# Patient Record
Sex: Female | Born: 1985
Health system: Southern US, Community
[De-identification: ages and names within clinical notes are randomized; demographics above are authoritative.]

## PROBLEM LIST (undated history)

## (undated) DIAGNOSIS — K219 Gastro-esophageal reflux disease without esophagitis: Secondary | ICD-10-CM

## (undated) DIAGNOSIS — E039 Hypothyroidism, unspecified: Secondary | ICD-10-CM

## (undated) DIAGNOSIS — G44209 Tension-type headache, unspecified, not intractable: Secondary | ICD-10-CM

## (undated) DIAGNOSIS — F514 Sleep terrors [night terrors]: Secondary | ICD-10-CM

## (undated) DIAGNOSIS — Z973 Presence of spectacles and contact lenses: Secondary | ICD-10-CM

## (undated) HISTORY — DX: Sleep terrors (night terrors): F51.4

## (undated) HISTORY — DX: Hypothyroidism, unspecified: E03.9

## (undated) HISTORY — DX: Gastro-esophageal reflux disease without esophagitis: K21.9

## (undated) HISTORY — DX: Tension-type headache, unspecified, not intractable: G44.209

## (undated) HISTORY — PX: ESOPHAGOGASTRODUODENOSCOPY: SHX1529

## (undated) HISTORY — DX: Presence of spectacles and contact lenses: Z97.3

## (undated) HISTORY — PX: COLONOSCOPY: SHX174

---

## 2008-09-18 HISTORY — PX: ESOPHAGOGASTRODUODENOSCOPY: SHX1529

## 2008-09-18 HISTORY — PX: COLONOSCOPY: SHX174

## 2018-03-25 ENCOUNTER — Ambulatory Visit (INDEPENDENT_AMBULATORY_CARE_PROVIDER_SITE_OTHER): Payer: BLUE CROSS/BLUE SHIELD | Admitting: Medical

## 2018-03-25 VITALS — BP 122/84 | HR 91 | Temp 98.5°F | Resp 16 | Ht 67.5 in | Wt 200.8 lb

## 2018-03-25 DIAGNOSIS — N750 Cyst of Bartholin's gland: Secondary | ICD-10-CM | POA: Diagnosis not present

## 2018-03-25 DIAGNOSIS — M7701 Medial epicondylitis, right elbow: Secondary | ICD-10-CM | POA: Diagnosis not present

## 2018-03-25 DIAGNOSIS — K219 Gastro-esophageal reflux disease without esophagitis: Secondary | ICD-10-CM | POA: Diagnosis not present

## 2018-03-25 DIAGNOSIS — F514 Sleep terrors [night terrors]: Secondary | ICD-10-CM | POA: Diagnosis not present

## 2018-03-25 MED ORDER — SULFAMETHOXAZOLE-TRIMETHOPRIM 800-160 MG PO TABS: 1.0000 | ORAL_TABLET | Freq: Two times a day (BID) | ORAL | 0 refills | Status: DC

## 2018-03-25 MED ORDER — RANITIDINE HCL 150 MG PO TABS: ORAL_TABLET | ORAL | 0 refills | Status: DC

## 2018-03-25 NOTE — Progress Notes (Signed)
Subjective: Chief Complaint  Patient presents with  . np    NP get established and refill medications   Here as a new patient.    New to this area.  Moved here in early May.  From [facility].   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  She notes a history of night terrors.  She was first diagnosed with this about 7 years ago.  Prior to getting married 7 years ago no one had ever observed her having a night tear but when she got married her husband was awakened by her night terrors when she would scream out and sleepwalk.  She had a sleep evaluation ultimately was diagnosed with night terrors.  Since the diagnosis has been using Klonopin nightly which seems to do just fine to help prevent her issues.  She just got 30-day supply last week so does not need a refill today.  When she was diagnosed 7 years ago she initially had abnormal thyroid and B12 labs but it since been checked have been normal.  Last labs about a year ago.  She notes when they were moving 2 months ago she was helping lift a lot of furniture and started having pains in her right medial elbow.  She initially had a joint injection of the medial epicondyle which helped briefly but then the pain has persisted.  She did rest the arm initially but she does use her right arm as she is right-handed.  She exercise but has not been doing anything particularly strenuous with the arm  She has a Nexplanon in place for the past year.  Was on IUD prior but was having ovarian cyst with this.     Her last physical and blood work was about a year ago.  She notes a history of Bartholin's gland cyst infection in the past 1 time prior until recently was put on Bactrim for a flareup of this.  It has much improved but there is still a little bit of a cyst there.  Wants this checked out today.  History of GERD, prior EGD and colonoscopy several years ago when first  diagnosed.  Does well on omeprazole 20 mg but she takes this intermittent.  Avoids spicy foods.   No past medical history on file.   Current Outpatient Medications on File Prior to Visit  Medication Sig Dispense Refill  . clonazePAM (KLONOPIN) 0.5 MG tablet Take 0.5 mg by mouth daily.    Marland Kitchen etonogestrel (NEXPLANON) 68 MG IMPL implant 1 each by Subdermal route once.    Marland Kitchen omeprazole (PRILOSEC) 20 MG capsule Take 20 mg by mouth daily.     No current facility-administered medications on file prior to visit.    ROS as in subjective    Objective: BP 122/84   Pulse 91   Temp 98.5 F (36.9 C) (Oral)   Resp 16   Ht 5' 7.5" (1.715 m)   Wt 200 lb 12.8 oz (91.1 kg)   SpO2 98%   BMI 30.99 kg/m   General well-developed well-nourished no acute distress white female Tender over right medial epicondyle, pain with resisted pronation and mildly tender with biceps flexion, but no swelling, no deformity, normal range of motion Arms neurovascularly intact Pelvic exam: Small slightly tender cystic lesion in the Bartholin's area of the right vulva without erythema or warmth or drainage or fluctuance or induration, otherwise external genitalia within normal limits,  exam chaperoned by nurse     Assessment: Encounter Diagnoses  Name Primary?  . Night terror Yes  . Medial epicondylitis of right elbow   . Gastroesophageal reflux disease without esophagitis   . Bartholin gland cyst       Plan: Night terrors-does fine on clonazepam once daily, will await records to come in.  Medial epicondylitis of right elbow- discussed symptoms and exam findings, advised ice twice daily for the next 3 to 5 days, NSAID over-the-counter for the next 3 to 5 days, use arm sling some for rest, discussed the role of rest, and if symptoms not improving by then a week and gradually improving and let me know.  Consider referral to physical therapy  GERD- will use a trial of ranitidine to see if this helps in terms of  avoiding long-term risk of PPI.  Avoid triggers.  Of note she has had prior EGD and colonoscopy in the remote past  Bartholin gland cyst- the recent infection seems to be resolving, advised warm bath soaks for the next several days but if any worsening symptoms she can begin back on another round of Bactrim.  Medication sent to pharmacy.  Shanda BumpsJessica was seen today for np.  Diagnoses and all orders for this visit:  Night terror  Medial epicondylitis of right elbow  Gastroesophageal reflux disease without esophagitis  Bartholin gland cyst  Other orders -     sulfamethoxazole-trimethoprim (BACTRIM DS,SEPTRA DS) 800-160 MG tablet; Take 1 tablet by mouth 2 (two) times daily. -     ranitidine (ZANTAC) 150 MG tablet; Once or BID     Shanda BumpsJessica was seen today for np.  Diagnoses and all orders for this visit:  Night terror  Medial epicondylitis of right elbow  Gastroesophageal reflux disease without esophagitis  Bartholin gland cyst  Other orders -     sulfamethoxazole-trimethoprim (BACTRIM DS,SEPTRA DS) 800-160 MG tablet; Take 1 tablet by mouth 2 (two) times daily. -     ranitidine (ZANTAC) 150 MG tablet; Once or BID

## 2018-04-16 ENCOUNTER — Encounter: Payer: Self-pay | Admitting: Medical

## 2018-04-18 ENCOUNTER — Telehealth: Payer: Self-pay | Admitting: Medical

## 2018-04-18 MED ORDER — CLONAZEPAM 0.5 MG PO TABS: 0.5000 mg | ORAL_TABLET | Freq: Every day | ORAL | 0 refills | Status: DC

## 2018-04-18 NOTE — Telephone Encounter (Signed)
Pt called and left voicemail that she needs a refill on her Klonopin states that shane would start filling them for her she is almost out of them, [facility] [address], [place] - [address] and pt would like to be called and [phone] if they are sent in,

## 2018-04-18 NOTE — Telephone Encounter (Signed)
Done KH 

## 2018-04-18 NOTE — Telephone Encounter (Signed)
Let her know that I called it in 

## 2018-05-01 ENCOUNTER — Telehealth: Payer: Self-pay | Admitting: Medical

## 2018-05-01 NOTE — Telephone Encounter (Signed)
Records received from [facility]Sending back for review. Please send to Cindi after review as immunization are noted thru out records. I highlighted the ones I notice but may be more.

## 2018-05-03 ENCOUNTER — Telehealth: Payer: Self-pay | Admitting: Medical

## 2018-05-03 NOTE — Telephone Encounter (Signed)
Refer to physical therapy for epicondylitis of elbow.   [facility] [place]

## 2018-05-06 ENCOUNTER — Telehealth: Payer: Self-pay | Admitting: Medical

## 2018-05-06 NOTE — Telephone Encounter (Signed)
done

## 2018-05-06 NOTE — Telephone Encounter (Signed)
[facility] to [facility]

## 2018-05-06 NOTE — Telephone Encounter (Signed)
Please refer her to [facility] for epicondylitis.

## 2018-05-07 DIAGNOSIS — M62838 Other muscle spasm: Secondary | ICD-10-CM | POA: Diagnosis not present

## 2018-05-07 DIAGNOSIS — M25521 Pain in right elbow: Secondary | ICD-10-CM | POA: Diagnosis not present

## 2018-05-07 DIAGNOSIS — M7701 Medial epicondylitis, right elbow: Secondary | ICD-10-CM | POA: Diagnosis not present

## 2018-05-10 DIAGNOSIS — M62838 Other muscle spasm: Secondary | ICD-10-CM | POA: Diagnosis not present

## 2018-05-10 DIAGNOSIS — M25521 Pain in right elbow: Secondary | ICD-10-CM | POA: Diagnosis not present

## 2018-05-10 DIAGNOSIS — M7701 Medial epicondylitis, right elbow: Secondary | ICD-10-CM | POA: Diagnosis not present

## 2018-05-16 ENCOUNTER — Other Ambulatory Visit: Payer: Self-pay | Admitting: Family Medicine

## 2018-05-16 DIAGNOSIS — M7701 Medial epicondylitis, right elbow: Secondary | ICD-10-CM | POA: Diagnosis not present

## 2018-05-16 DIAGNOSIS — M25521 Pain in right elbow: Secondary | ICD-10-CM | POA: Diagnosis not present

## 2018-05-16 DIAGNOSIS — M62838 Other muscle spasm: Secondary | ICD-10-CM | POA: Diagnosis not present

## 2018-05-17 MED ORDER — CLONAZEPAM 0.5 MG PO TABS: 0.5000 mg | ORAL_TABLET | Freq: Every day | ORAL | 0 refills | Status: DC

## 2018-05-17 NOTE — Telephone Encounter (Signed)
[facility] pt [facility]opin. [facility]

## 2018-05-21 DIAGNOSIS — M7701 Medial epicondylitis, right elbow: Secondary | ICD-10-CM | POA: Diagnosis not present

## 2018-05-21 DIAGNOSIS — M25521 Pain in right elbow: Secondary | ICD-10-CM | POA: Diagnosis not present

## 2018-05-21 DIAGNOSIS — M62838 Other muscle spasm: Secondary | ICD-10-CM | POA: Diagnosis not present

## 2018-05-22 ENCOUNTER — Encounter: Payer: Self-pay | Admitting: Medical

## 2018-05-24 DIAGNOSIS — M7701 Medial epicondylitis, right elbow: Secondary | ICD-10-CM | POA: Diagnosis not present

## 2018-05-24 DIAGNOSIS — M25521 Pain in right elbow: Secondary | ICD-10-CM | POA: Diagnosis not present

## 2018-05-24 DIAGNOSIS — M62838 Other muscle spasm: Secondary | ICD-10-CM | POA: Diagnosis not present

## 2018-05-30 ENCOUNTER — Telehealth: Payer: Self-pay

## 2018-05-30 NOTE — Telephone Encounter (Signed)
Referral to [facility] and she had an appt 05-07-18

## 2018-06-04 DIAGNOSIS — M7701 Medial epicondylitis, right elbow: Secondary | ICD-10-CM | POA: Diagnosis not present

## 2018-06-04 DIAGNOSIS — M62838 Other muscle spasm: Secondary | ICD-10-CM | POA: Diagnosis not present

## 2018-06-04 DIAGNOSIS — M25521 Pain in right elbow: Secondary | ICD-10-CM | POA: Diagnosis not present

## 2018-06-07 DIAGNOSIS — M62838 Other muscle spasm: Secondary | ICD-10-CM | POA: Diagnosis not present

## 2018-06-07 DIAGNOSIS — M7701 Medial epicondylitis, right elbow: Secondary | ICD-10-CM | POA: Diagnosis not present

## 2018-06-07 DIAGNOSIS — M25521 Pain in right elbow: Secondary | ICD-10-CM | POA: Diagnosis not present

## 2018-06-13 DIAGNOSIS — M62838 Other muscle spasm: Secondary | ICD-10-CM | POA: Diagnosis not present

## 2018-06-13 DIAGNOSIS — M25521 Pain in right elbow: Secondary | ICD-10-CM | POA: Diagnosis not present

## 2018-06-13 DIAGNOSIS — M7701 Medial epicondylitis, right elbow: Secondary | ICD-10-CM | POA: Diagnosis not present

## 2018-06-16 ENCOUNTER — Other Ambulatory Visit: Payer: Self-pay

## 2018-06-17 MED ORDER — CLONAZEPAM 0.5 MG PO TABS: 0.5000 mg | ORAL_TABLET | Freq: Every day | ORAL | 0 refills | Status: DC

## 2018-06-17 NOTE — Telephone Encounter (Signed)
Is this ok to refill?  

## 2018-06-17 NOTE — Telephone Encounter (Signed)
Approve but make sure we have scheduled for either physical or upcoming med check.

## 2018-06-18 HISTORY — PX: NO PAST SURGERIES: SHX2092

## 2018-06-21 DIAGNOSIS — M62838 Other muscle spasm: Secondary | ICD-10-CM | POA: Diagnosis not present

## 2018-06-21 DIAGNOSIS — M7701 Medial epicondylitis, right elbow: Secondary | ICD-10-CM | POA: Diagnosis not present

## 2018-06-21 DIAGNOSIS — M25521 Pain in right elbow: Secondary | ICD-10-CM | POA: Diagnosis not present

## 2018-06-26 ENCOUNTER — Ambulatory Visit (INDEPENDENT_AMBULATORY_CARE_PROVIDER_SITE_OTHER): Payer: BLUE CROSS/BLUE SHIELD | Admitting: Medical

## 2018-06-26 ENCOUNTER — Encounter: Payer: Self-pay | Admitting: Medical

## 2018-06-26 VITALS — BP 120/76 | HR 82 | Temp 97.7°F | Resp 16 | Ht 67.0 in | Wt 197.8 lb

## 2018-06-26 DIAGNOSIS — Z13220 Encounter for screening for lipoid disorders: Secondary | ICD-10-CM | POA: Insufficient documentation

## 2018-06-26 DIAGNOSIS — E538 Deficiency of other specified B group vitamins: Secondary | ICD-10-CM | POA: Diagnosis not present

## 2018-06-26 DIAGNOSIS — Z0000 Encounter for general adult medical examination without abnormal findings: Secondary | ICD-10-CM

## 2018-06-26 DIAGNOSIS — K219 Gastro-esophageal reflux disease without esophagitis: Secondary | ICD-10-CM | POA: Diagnosis not present

## 2018-06-26 DIAGNOSIS — F514 Sleep terrors [night terrors]: Secondary | ICD-10-CM | POA: Diagnosis not present

## 2018-06-26 DIAGNOSIS — R7989 Other specified abnormal findings of blood chemistry: Secondary | ICD-10-CM | POA: Diagnosis not present

## 2018-06-26 DIAGNOSIS — M7701 Medial epicondylitis, right elbow: Secondary | ICD-10-CM | POA: Insufficient documentation

## 2018-06-26 LAB — POCT URINALYSIS DIP (PROADVANTAGE DEVICE)
Bilirubin, UA: NEGATIVE
GLUCOSE UA: NEGATIVE mg/dL
Ketones, POC UA: NEGATIVE mg/dL
Leukocytes, UA: NEGATIVE
NITRITE UA: NEGATIVE
Protein Ur, POC: NEGATIVE mg/dL
Specific Gravity, Urine: 1.01
UUROB: NEGATIVE
pH, UA: 6 (ref 5.0–8.0)

## 2018-06-26 NOTE — Progress Notes (Signed)
Subjective:   HPI  Nancy Jenkins is a 32 y.o. female who presents for Chief Complaint  Patient presents with  . cpe    fasting cpe pap 07-09-2017 vision 2-19   Medical care team includes: Tysinger, Kermit Balo, PA-C here for primary care Dentist Eye doctor  Concerns: She was a new patient her last visit a few months ago.  Doing well in general.  Working towards a 5K.  She has issues with medial epicondylitis of the right arm been seen in physical therapy for this and is much improved.  Had recent dry needling.  PT just released her to start back exercising some with weight lifting  Last Tdap 2015, just got flu shot begging of September.  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  Reviewed their medical, surgical, family, social, medication, and allergy history and updated chart as appropriate.  Past Medical History:  Diagnosis Date  . GERD (gastroesophageal reflux disease)   . Night terrors   . Tension headache   . Thyroid activity decreased    in past  . Wears contact lenses      Social History   Socioeconomic History  . Marital status: Married    Spouse name: Not on file  . Number of children: Not on file  . Years of education: Not on file  . Highest education level: Not on file  Occupational History  . Not on file  Social Needs  . Financial resource strain: Not on file  . Food insecurity:    Worry: Not on file    Inability: Not on file  . Transportation needs:    Medical: Not on file    Non-medical: Not on file  Tobacco Use  . Smoking status: Never Smoker  . Smokeless tobacco: Never Used  Substance and Sexual Activity  . Alcohol use: Yes    Comment: occasional beer  . Drug use: Not Currently  . Sexual activity: Not on file  Lifestyle  . Physical activity:    Days per week: Not on file    Minutes per session: Not on file  . Stress: Not on file  Relationships  . Social  connections:    Talks on phone: Not on file    Gets together: Not on file    Attends religious service: Not on file    Active member of club or organization: Not on file    Attends meetings of clubs or organizations: Not on file    Relationship status: Not on file  . Intimate partner violence:    Fear of current or ex partner: Not on file    Emotionally abused: Not on file    Physically abused: Not on file    Forced sexual activity: Not on file  Other Topics Concern  . Not on file  Social History Narrative   Married, has son, daughter, exercises with swimming and running.  Using cough to 5k app, doing about 3 miles three times per week.   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 Diabetes Mother   . Depression Mother   . Hypertension Father   . Deep vein thrombosis Father   . Ulcerative colitis Father   . Heart disease Brother        congenital  . Hypertension Maternal Grandmother   . Cancer Maternal Grandfather  larynx  . Hypertension Paternal Grandmother   . Stroke Paternal Grandmother   . Cancer Paternal Grandfather        liver     Current Outpatient Medications:  .  clonazePAM (KLONOPIN) 0.5 MG tablet, Take 1 tablet (0.5 mg total) by mouth daily., Disp: 30 tablet, Rfl: 0 .  etonogestrel (NEXPLANON) 68 MG IMPL implant, 1 each by Subdermal route once., Disp: , Rfl:  .  omeprazole (PRILOSEC) 20 MG capsule, Take 20 mg by mouth daily., Disp: , Rfl:   Not on File   Review of Systems Constitutional: -fever, -chills, -sweats, -unexpected weight change, -decreased appetite, -fatigue Allergy: -sneezing, -itching, -congestion Dermatology: -changing moles, --rash, -lumps ENT: -runny nose, -ear pain, -sore throat, -hoarseness, -sinus pain, -teeth pain, - ringing in ears, -hearing loss, -nosebleeds Cardiology: -chest pain, -palpitations, -swelling, -difficulty breathing when lying flat, -waking up  short of breath Respiratory: -cough, -shortness of breath, -difficulty breathing with exercise or exertion, -wheezing, -coughing up blood Gastroenterology: -abdominal pain, -nausea, -vomiting, -diarrhea, -constipation, -blood in stool, -changes in bowel movement, -difficulty swallowing or eating Hematology: -bleeding, -bruising  Musculoskeletal: +joint aches, -muscle aches, -joint swelling, -back pain, -neck pain, -cramping, -changes in gait Ophthalmology: denies vision changes, eye redness, itching, discharge Urology: -burning with urination, -difficulty urinating, -blood in urine, -urinary frequency, -urgency, -incontinence Neurology: -headache, -weakness, -tingling, -numbness, -memory loss, -falls, -dizziness Psychology: -depressed mood, -agitation, -sleep problems Breast/gyn: -breast tenderness, -discharge, -lumps, -vaginal discharge,- irregular periods, -heavy periods     Objective:  BP 120/76   Pulse 82   Temp 97.7 F (36.5 C) (Oral)   Resp 16   Ht 5\' 7"  (1.702 m)   Wt 197 lb 12.8 oz (89.7 kg)   SpO2 97%   BMI 30.98 kg/m   General appearance: alert, no distress, WD/WN, Caucasian female Skin: unremarkable, no worrisome lesions HEENT: normocephalic, conjunctiva/corneas normal, sclerae anicteric, PERRLA, EOMi, nares patent, no discharge or erythema, pharynx normal Oral cavity: MMM, tongue normal, teeth normal Neck: supple, no lymphadenopathy, no thyromegaly, no masses, normal ROM, no bruits Chest: non tender, normal shape and expansion Heart: RRR, normal S1, S2, no murmurs Lungs: CTA bilaterally, no wheezes, rhonchi, or rales Abdomen: +bs, soft, non tender, non distended, no masses, no hepatomegaly, no splenomegaly, no bruits Back: non tender, normal ROM, no scoliosis Musculoskeletal: upper extremities non tender, no obvious deformity, normal ROM throughout, lower extremities non tender, no obvious deformity, normal ROM throughout Extremities: no edema, no cyanosis, no  clubbing Pulses: 2+ symmetric, upper and lower extremities, normal cap refill Neurological: alert, oriented x 3, CN2-12 intact, strength normal upper extremities and lower extremities, sensation normal throughout, DTRs 2+ throughout, no cerebellar signs, gait normal Psychiatric: normal affect, behavior normal, pleasant  Breast/gyn/rectal - deferred to gynecology   Assessment and Plan :   Encounter Diagnoses  Name Primary?  . Routine general medical examination at a health care facility Yes  . Gastroesophageal reflux disease without esophagitis   . B12 deficiency   . Night terrors   . Medial epicondylitis of right elbow   . Abnormal thyroid blood test     Physical exam - discussed and counseled on healthy lifestyle, diet, exercise, preventative care, vaccinations, sick and well care, proper use of emergency dept and after hours care, and addressed their concerns.    Health screening: See your eye doctor yearly for routine vision care. See your dentist yearly for routine dental care including hygiene visits twice yearly.  Cancer screening Sees gynecology regularly, up to date on  pap 1 year ago  Vaccinations: Advised yearly influenza vaccine She had this in 05/2018 through work  Tdap 2015   Acute issues discussed: none  Separate significant chronic issues discussed: GERD-continue current 20 mg omeprazole, avoid triggers, discussed risk and benefits of medication.  Prior EGD 10 years ago  Medical epicondylitis - much improved, still seeing PT  Night terrors - reviewed prior records, does fine on current medication  Labs today to eval other concerns.  Nancy Jenkins was seen today for cpe.  Diagnoses and all orders for this visit:  Routine general medical examination at a health care facility -     POCT Urinalysis DIP (Proadvantage Device) -     Comprehensive metabolic panel -     CBC with Differential/Platelet -     Lipid panel -     TSH -     T4, free -     VITAMIN D 25  Hydroxy (Vit-D Deficiency, Fractures) -     Vitamin B12  Gastroesophageal reflux disease without esophagitis  B12 deficiency -     Vitamin B12  Night terrors  Medial epicondylitis of right elbow  Abnormal thyroid blood test -     TSH -     T4, free   Follow-up pending labs, yearly for physical

## 2018-06-26 NOTE — Patient Instructions (Signed)
Thanks for trusting [place] with your health care and for coming in for a physical today.  Below are some general recommendations I have for you:  Yearly screenings See your eye doctor yearly for routine vision care. See your dentist yearly for routine dental care including hygiene visits twice yearly. See me here yearly for a routine physical and preventative care visit   Dr. Yancey Flemings, dentist [address], [place], [place] [address] [phone] [email or web address]    Preventative Care for Adults - Female      MAINTAIN REGULAR HEALTH EXAMS:  A routine yearly physical is a good way to check in with your primary care provider about your health and preventive screening. It is also an opportunity to share updates about your health and any concerns you have, and receive a thorough all-over exam.   Most health insurance companies pay for at least some preventative services.  Check with your health plan for specific coverages.  WHAT PREVENTATIVE SERVICES DO WOMEN NEED?  Adult women should have their weight and blood pressure checked regularly.   Women age 46 and older should have their cholesterol levels checked regularly.  Women should be screened for cervical cancer with a Pap smear and pelvic exam beginning at either age 62, or 3 years after they become sexually activity.    Breast cancer screening generally begins at age 56 with a mammogram and breast exam by your primary care provider.    Beginning at age 76 and continuing to age 33, women should be screened for colorectal cancer.  Certain people may need continued testing until age 50.  Updating vaccinations is part of preventative care.  Vaccinations help protect against diseases such as the flu.  Osteoporosis is a disease in which the bones lose minerals and strength as we age. Women ages 78 and over should discuss this with their caregivers, as should women after menopause who have other risk factors.  Lab tests are generally  done as part of preventative care to screen for anemia and blood disorders, to screen for problems with the kidneys and liver, to screen for bladder problems, to check blood sugar, and to check your cholesterol level.  Preventative services generally include counseling about diet, exercise, avoiding tobacco, drugs, excessive alcohol consumption, and sexually transmitted infections.    GENERAL RECOMMENDATIONS FOR GOOD HEALTH:  Healthy diet:  Eat a variety of foods, including fruit, vegetables, animal or vegetable protein, such as meat, fish, chicken, and eggs, or beans, lentils, tofu, and grains, such as rice.  Drink plenty of water daily.  Decrease saturated fat in the diet, avoid lots of red meat, processed foods, sweets, fast foods, and fried foods.  Exercise:  Aerobic exercise helps maintain good heart health. At least 30-40 minutes of moderate-intensity exercise is recommended. For example, a brisk walk that increases your heart rate and breathing. This should be done on most days of the week.   Find a type of exercise or a variety of exercises that you enjoy so that it becomes a part of your daily life.  Examples are running, walking, swimming, water aerobics, and biking.  For motivation and support, explore group exercise such as aerobic class, spin class, Zumba, Yoga,or  martial arts, etc.    Set exercise goals for yourself, such as a certain weight goal, walk or run in a race such as a 5k walk/run.  Speak to your primary care provider about exercise goals.  Disease prevention:  If you smoke or chew tobacco, find  out from your caregiver how to quit. It can literally save your life, no matter how long you have been a tobacco user. If you do not use tobacco, never begin.   Maintain a healthy diet and normal weight. Increased weight leads to problems with blood pressure and diabetes.   The Body Mass Index or BMI is a way of measuring how much of your body is fat. Having a BMI above 27  increases the risk of heart disease, diabetes, hypertension, stroke and other problems related to obesity. Your caregiver can help determine your BMI and based on it develop an exercise and dietary program to help you achieve or maintain this important measurement at a healthful level.  High blood pressure causes heart and blood vessel problems.  Persistent high blood pressure should be treated with medicine if weight loss and exercise do not work.   Fat and cholesterol leaves deposits in your arteries that can block them. This causes heart disease and vessel disease elsewhere in your body.  If your cholesterol is found to be high, or if you have heart disease or certain other medical conditions, then you may need to have your cholesterol monitored frequently and be treated with medication.   Ask if you should have a cardiac stress test if your history suggests this. A stress test is a test done on a treadmill that looks for heart disease. This test can find disease prior to there being a problem.  Menopause can be associated with physical symptoms and risks. Hormone replacement therapy is available to decrease these. You should talk to your caregiver about whether starting or continuing to take hormones is right for you.   Osteoporosis is a disease in which the bones lose minerals and strength as we age. This can result in serious bone fractures. Risk of osteoporosis can be identified using a bone density scan. Women ages 23 and over should discuss this with their caregivers, as should women after menopause who have other risk factors. Ask your caregiver whether you should be taking a calcium supplement and Vitamin D, to reduce the rate of osteoporosis.   Avoid drinking alcohol in excess (more than two drinks per day).  Avoid use of street drugs. Do not share needles with anyone. Ask for professional help if you need assistance or instructions on stopping the use of alcohol, cigarettes, and/or  drugs.  Brush your teeth twice a day with fluoride toothpaste, and floss once a day. Good oral hygiene prevents tooth decay and gum disease. The problems can be painful, unattractive, and can cause other health problems. Visit your dentist for a routine oral and dental check up and preventive care every 6-12 months.   Look at your skin regularly.  Use a mirror to look at your back. Notify your caregivers of changes in moles, especially if there are changes in shapes, colors, a size larger than a pencil eraser, an irregular border, or development of new moles.  Safety:  Use seatbelts 100% of the time, whether driving or as a passenger.  Use safety devices such as hearing protection if you work in environments with loud noise or significant background noise.  Use safety glasses when doing any work that could send debris in to the eyes.  Use a helmet if you ride a bike or motorcycle.  Use appropriate safety gear for contact sports.  Talk to your caregiver about gun safety.  Use sunscreen with a SPF (or skin protection factor) of 15 or greater.  Lighter skinned people are at a greater risk of skin cancer. Don't forget to also wear sunglasses in order to protect your eyes from too much damaging sunlight. Damaging sunlight can accelerate cataract formation.   Practice safe sex. Use condoms. Condoms are used for birth control and to help reduce the spread of sexually transmitted infections (or STIs).  Some of the STIs are gonorrhea (the clap), chlamydia, syphilis, trichomonas, herpes, HPV (human papilloma virus) and HIV (human immunodeficiency virus) which causes AIDS. The herpes, HIV and HPV are viral illnesses that have no cure. These can result in disability, cancer and death.   Keep carbon monoxide and smoke detectors in your home functioning at all times. Change the batteries every 6 months or use a model that plugs into the wall.   Vaccinations:  Stay up to date with your tetanus shots and other  required immunizations. You should have a booster for tetanus every 10 years. Be sure to get your flu shot every year, since 5%-20% of the [place] population comes down with the flu. The flu vaccine changes each year, so being vaccinated once is not enough. Get your shot in the fall, before the flu season peaks.   Other vaccines to consider:  Human Papilloma Virus or HPV causes cancer of the cervix, and other infections that can be transmitted from person to person. There is a vaccine for HPV, and females should get immunized between the ages of 56 and 72. It requires a series of 3 shots.   Pneumococcal vaccine to protect against certain types of pneumonia.  This is normally recommended for adults age 79 or older.  However, adults younger than 32 years old with certain underlying conditions such as diabetes, heart or lung disease should also receive the vaccine.  Shingles vaccine to protect against Varicella Zoster if you are older than age 23, or younger than 32 years old with certain underlying illness.  If you have not had the Shingrix vaccine, please call your insurer to inquire about coverage for the Shingrix vaccine given in 2 doses.   Some insurers cover this vaccine after age 72, some cover this after age 24.  If your insurer covers this, then call to schedule appointment to have this vaccine here  Hepatitis A vaccine to protect against a form of infection of the liver by a virus acquired from food.  Hepatitis B vaccine to protect against a form of infection of the liver by a virus acquired from blood or body fluids, particularly if you work in health care.  If you plan to travel internationally, check with your local health department for specific vaccination recommendations.  Cancer Screening:  Breast cancer screening is essential to preventive care for women. All women age 24 and older should perform a breast self-exam every month. At age 61 and older, women should have their caregiver  complete a breast exam each year. Women at ages 63 and older should have a mammogram (x-ray film) of the breasts. Your caregiver can discuss how often you need mammograms.    Cervical cancer screening includes taking a Pap smear (sample of cells examined under a microscope) from the cervix (end of the uterus). It also includes testing for HPV (Human Papilloma Virus, which can cause cervical cancer). Screening and a pelvic exam should begin at age 1, or 3 years after a woman becomes sexually active. Screening should occur every year, with a Pap smear but no HPV testing, up to age 64. After age 66, you  should have a Pap smear every 3 years with HPV testing, if no HPV was found previously.   Most routine colon cancer screening begins at the age of 32. On a yearly basis, doctors may provide special easy to use take-home tests to check for hidden blood in the stool. Sigmoidoscopy or colonoscopy can detect the earliest forms of colon cancer and is life saving. These tests use a small camera at the end of a tube to directly examine the colon. Speak to your caregiver about this at age 60, when routine screening begins (and is repeated every 5 years unless early forms of pre-cancerous polyps or small growths are found).

## 2018-06-27 ENCOUNTER — Other Ambulatory Visit: Payer: Self-pay | Admitting: Medical

## 2018-06-27 ENCOUNTER — Other Ambulatory Visit: Payer: Self-pay

## 2018-06-27 DIAGNOSIS — K219 Gastro-esophageal reflux disease without esophagitis: Secondary | ICD-10-CM

## 2018-06-27 LAB — COMPREHENSIVE METABOLIC PANEL
ALBUMIN: 4.4 g/dL (ref 3.5–5.5)
ALK PHOS: 62 IU/L (ref 39–117)
ALT: 23 IU/L (ref 0–32)
AST: 19 IU/L (ref 0–40)
Albumin/Globulin Ratio: 1.4 (ref 1.2–2.2)
BUN / CREAT RATIO: 12 (ref 9–23)
BUN: 11 mg/dL (ref 6–20)
Bilirubin Total: 0.4 mg/dL (ref 0.0–1.2)
CO2: 24 mmol/L (ref 20–29)
CREATININE: 0.91 mg/dL (ref 0.57–1.00)
Calcium: 9.9 mg/dL (ref 8.7–10.2)
Chloride: 100 mmol/L (ref 96–106)
GFR calc Af Amer: 97 mL/min/{1.73_m2} (ref >59)
GFR calc non Af Amer: 84 mL/min/{1.73_m2} (ref >59)
GLUCOSE: 84 mg/dL (ref 65–99)
Globulin, Total: 3.2 g/dL (ref 1.5–4.5)
Potassium: 4.7 mmol/L (ref 3.5–5.2)
Sodium: 140 mmol/L (ref 134–144)
Total Protein: 7.6 g/dL (ref 6.0–8.5)

## 2018-06-27 LAB — CBC WITH DIFFERENTIAL/PLATELET
BASOS: 1 %
Basophils Absolute: 0 10*3/uL (ref 0.0–0.2)
EOS (ABSOLUTE): 0.1 10*3/uL (ref 0.0–0.4)
Eos: 1 %
Hematocrit: 41.3 % (ref 34.0–46.6)
Hemoglobin: 13.8 g/dL (ref 11.1–15.9)
Immature Grans (Abs): 0 10*3/uL (ref 0.0–0.1)
Immature Granulocytes: 0 %
LYMPHS ABS: 1.7 10*3/uL (ref 0.7–3.1)
Lymphs: 25 %
MCH: 29.7 pg (ref 26.6–33.0)
MCHC: 33.4 g/dL (ref 31.5–35.7)
MCV: 89 fL (ref 79–97)
MONOS ABS: 0.6 10*3/uL (ref 0.1–0.9)
Monocytes: 9 %
NEUTROS ABS: 4.3 10*3/uL (ref 1.4–7.0)
NEUTROS PCT: 64 %
Platelets: 333 10*3/uL (ref 150–450)
RBC: 4.64 x10E6/uL (ref 3.77–5.28)
RDW: 12.6 % (ref 12.3–15.4)
WBC: 6.7 10*3/uL (ref 3.4–10.8)

## 2018-06-27 LAB — VITAMIN D 25 HYDROXY (VIT D DEFICIENCY, FRACTURES): Vit D, 25-Hydroxy: 33.6 ng/mL (ref 30.0–100.0)

## 2018-06-27 LAB — LIPID PANEL
CHOL/HDL RATIO: 3.3 ratio (ref 0.0–4.4)
Cholesterol, Total: 154 mg/dL (ref 100–199)
HDL: 46 mg/dL (ref >39)
LDL Calculated: 94 mg/dL (ref 0–99)
Triglycerides: 70 mg/dL (ref 0–149)
VLDL Cholesterol Cal: 14 mg/dL (ref 5–40)

## 2018-06-27 LAB — T4, FREE: FREE T4: 1.03 ng/dL (ref 0.82–1.77)

## 2018-06-27 LAB — VITAMIN B12: VITAMIN B 12: 732 pg/mL (ref 232–1245)

## 2018-06-27 LAB — TSH: TSH: 4.49 u[IU]/mL (ref 0.450–4.500)

## 2018-06-27 MED ORDER — OMEPRAZOLE 20 MG PO CPDR: 20.0000 mg | DELAYED_RELEASE_CAPSULE | Freq: Every day | ORAL | 1 refills | Status: DC

## 2018-06-27 MED ORDER — CLONAZEPAM 0.5 MG PO TABS: 0.5000 mg | ORAL_TABLET | Freq: Every day | ORAL | 5 refills | Status: DC

## 2018-07-11 DIAGNOSIS — M7701 Medial epicondylitis, right elbow: Secondary | ICD-10-CM | POA: Diagnosis not present

## 2018-07-11 DIAGNOSIS — M25521 Pain in right elbow: Secondary | ICD-10-CM | POA: Diagnosis not present

## 2018-07-11 DIAGNOSIS — M62838 Other muscle spasm: Secondary | ICD-10-CM | POA: Diagnosis not present

## 2018-07-25 DIAGNOSIS — M62838 Other muscle spasm: Secondary | ICD-10-CM | POA: Diagnosis not present

## 2018-07-25 DIAGNOSIS — M7701 Medial epicondylitis, right elbow: Secondary | ICD-10-CM | POA: Diagnosis not present

## 2018-07-25 DIAGNOSIS — M25521 Pain in right elbow: Secondary | ICD-10-CM | POA: Diagnosis not present

## 2018-08-08 DIAGNOSIS — M62838 Other muscle spasm: Secondary | ICD-10-CM | POA: Diagnosis not present

## 2018-08-08 DIAGNOSIS — M7701 Medial epicondylitis, right elbow: Secondary | ICD-10-CM | POA: Diagnosis not present

## 2018-08-08 DIAGNOSIS — M25521 Pain in right elbow: Secondary | ICD-10-CM | POA: Diagnosis not present

## 2018-09-24 ENCOUNTER — Encounter: Payer: BLUE CROSS/BLUE SHIELD | Admitting: Medical

## 2018-10-02 ENCOUNTER — Ambulatory Visit (INDEPENDENT_AMBULATORY_CARE_PROVIDER_SITE_OTHER): Payer: BLUE CROSS/BLUE SHIELD | Admitting: Medical

## 2018-10-02 VITALS — BP 120/74 | HR 99 | Temp 97.7°F | Resp 16 | Wt 204.6 lb

## 2018-10-02 DIAGNOSIS — K219 Gastro-esophageal reflux disease without esophagitis: Secondary | ICD-10-CM

## 2018-10-02 DIAGNOSIS — F514 Sleep terrors [night terrors]: Secondary | ICD-10-CM

## 2018-10-02 NOTE — Progress Notes (Signed)
Subjective: Chief Complaint  Patient presents with  . med check    med check    Here for med check.  She notes a history of night terrors.  Doing well on current medications.  She was first diagnosed with this about 7 years ago.  Prior to getting married 7 years ago no one had ever observed her having a night tear but when she got married her husband was awakened by her night terrors when she would scream out and sleepwalk.  She had a sleep evaluation ultimately was diagnosed with night terrors.  Since the diagnosis has been using Klonopin nightly which seems to do just fine to help prevent her issues.    History of GERD, prior EGD and colonoscopy several years ago when first diagnosed.  Does well on omeprazole 20 mg but she takes this intermittent.  Avoids spicy foods.   Past Medical History:  Diagnosis Date  . GERD (gastroesophageal reflux disease)   . Night terrors   . Tension headache   . Thyroid activity decreased    in past  . Wears contact lenses      Current Outpatient Medications on File Prior to Visit  Medication Sig Dispense Refill  . clonazePAM (KLONOPIN) 0.5 MG tablet Take 1 tablet (0.5 mg total) by mouth daily. 30 tablet 5  . etonogestrel (NEXPLANON) 68 MG IMPL implant 1 each by Subdermal route once.    Marland Kitchen omeprazole (PRILOSEC) 20 MG capsule Take 1 capsule (20 mg total) by mouth daily. 90 capsule 1   No current facility-administered medications on file prior to visit.    ROS as in subjective     Objective: BP 120/74   Pulse 99   Temp 97.7 F (36.5 C) (Oral)   Resp 16   Wt 204 lb 9.6 oz (92.8 kg)   SpO2 98%   BMI 32.04 kg/m   General well-developed well-nourished no acute distress white female Psych: pleasant, good eye contact, answers questions appropriately    Assessment:  Encounter Diagnoses  Name Primary?  . Night terrors Yes  . Gastroesophageal reflux disease without esophagitis       Plan: Night terrors-does fine on clonazepam once  daily  GERD- discussed diet, precautions, c/t current medication and discussed long-term risk of PPI.  Avoid triggers.  Of note she has had prior EGD and colonoscopy in the remote past  Nancy Jenkins was seen today for med check.  Diagnoses and all orders for this visit:  Night terrors  Gastroesophageal reflux disease without esophagitis

## 2018-12-30 ENCOUNTER — Telehealth: Payer: Self-pay | Admitting: Medical

## 2018-12-30 NOTE — Telephone Encounter (Signed)
Pt is on the schedule for 12/31/2018

## 2018-12-30 NOTE — Telephone Encounter (Signed)
Please set her up a virtual visit to discuss medications

## 2018-12-31 ENCOUNTER — Ambulatory Visit (INDEPENDENT_AMBULATORY_CARE_PROVIDER_SITE_OTHER): Payer: BLUE CROSS/BLUE SHIELD | Admitting: Medical

## 2018-12-31 ENCOUNTER — Other Ambulatory Visit: Payer: Self-pay

## 2018-12-31 ENCOUNTER — Encounter: Payer: Self-pay | Admitting: Medical

## 2018-12-31 VITALS — Temp 97.1°F | Ht 66.0 in | Wt 193.0 lb

## 2018-12-31 DIAGNOSIS — E669 Obesity, unspecified: Secondary | ICD-10-CM | POA: Diagnosis not present

## 2018-12-31 DIAGNOSIS — R7989 Other specified abnormal findings of blood chemistry: Secondary | ICD-10-CM

## 2018-12-31 MED ORDER — NALTREXONE-BUPROPION HCL ER 8-90 MG PO TB12: 2.0000 | ORAL_TABLET | Freq: Two times a day (BID) | ORAL | 1 refills | Status: DC

## 2018-12-31 NOTE — Progress Notes (Signed)
Subjective:     Patient ID: Nancy Jenkins, female   DOB: 09/06/1986, 33 y.o.   MRN: [id number]  This visit type was conducted due to national recommendations for restrictions regarding the COVID-19 Pandemic (e.g. social distancing) in an effort to limit this patient's exposure and mitigate transmission in our community.  his format is felt to be most appropriate for this patient at this time.    Documentation for virtual audio and video telecommunications through Zoom encounter:  The patient was located at home. The provider was located in the office. The patient did consent to this visit and is aware of possible charges through their insurance for this visit.  The other persons participating in this telemedicine service were none. Time spent on call was 15 minutes and in review of previous records >20 minutes total.  This virtual service is not related to other E/M service within previous 7 days.   HPI Chief Complaint  Patient presents with  . med check    med check    Virtual visit today to discuss weight and obesity.  She would like to go back on Contrave.  She was on this this past year and lost 25 pounds, tolerated the medication well without side effect.  She was on the regular dose of 2 tablets twice daily.  Back of the first of the year she was doing ketogenic eating program and lost down to 189 pounds, was exercising regularly, ran a 5K in January and was doing well with her plan. 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  She is still exercising, but this has been difficult with her kids being confined to the home, having to do lessons for her kids and having to adjust work hours because the pandemic.  She is continuing to find ways to walk or do other exercise.  Wants to get back into running more regularly.  Would like to do  another 5K.  She has used carb counting app on the phone to help track calories and uses her apple watch to track steps  She denies any change in appetite, mood is good, no chest pain no swelling no edema no shortness of breath.  Past Medical History:  Diagnosis Date  . GERD (gastroesophageal reflux disease)   . Night terrors   . Tension headache   . Thyroid activity decreased    in past  . Wears contact lenses    Current Outpatient Medications on File Prior to Visit  Medication Sig Dispense Refill  . clonazePAM (KLONOPIN) 0.5 MG tablet Take 1 tablet (0.5 mg total) by mouth daily. 30 tablet 5  . etonogestrel (NEXPLANON) 68 MG IMPL implant 1 each by Subdermal route once.    Marland Kitchen. omeprazole (PRILOSEC) 20 MG capsule Take 1 capsule (20 mg total) by mouth daily. 90 capsule 1   No current facility-administered medications on file prior to visit.     Review of Systems As in subjective    Objective:   Physical Exam Due to coronavirus pandemic stay at home measures, patient visit was virtual and they were not examined in person.   Gen: wd, wn, nad      Assessment:     Encounter Diagnoses  Name Primary?  . Obesity, unspecified classification, unspecified obesity type, unspecified whether serious comorbidity present Yes  . Abnormal thyroid blood test     .  Plan:     We discussed exercise, continuing to set goals for herself, continue back with running, discussed diet, continue ketogenic diet or get back on track with what she was doing that was successful with low-carb diet.  Advise she use the smart phone app to help track calories and efforts, advise she use her smart watch to track steps and to gradually increase her goal steps for the day  Begin back on Contrave with titration up as discussed and as below.  Discussed risk and benefits.  Advise she recheck in 1 month through my chart on efforts, tolerability, and we will need to recheck thyroid labs in the next 1 to 2  months  Nancy Jenkins was seen today for med check.  Diagnoses and all orders for this visit:  Obesity, unspecified classification, unspecified obesity type, unspecified whether serious comorbidity present  Abnormal thyroid blood test  Other orders -     Naltrexone-buPROPion HCl ER 8-90 MG TB12; Take 2 tablets by mouth 2 (two) times daily with a meal.

## 2019-01-01 ENCOUNTER — Telehealth: Payer: Self-pay | Admitting: Medical

## 2019-01-01 ENCOUNTER — Other Ambulatory Visit: Payer: Self-pay | Admitting: Medical

## 2019-01-01 DIAGNOSIS — K219 Gastro-esophageal reflux disease without esophagitis: Secondary | ICD-10-CM

## 2019-01-01 MED ORDER — CLONAZEPAM 0.5 MG PO TABS: 0.5000 mg | ORAL_TABLET | Freq: Every day | ORAL | 0 refills | Status: DC

## 2019-01-01 MED ORDER — OMEPRAZOLE 20 MG PO CPDR: 20.0000 mg | DELAYED_RELEASE_CAPSULE | Freq: Every day | ORAL | 1 refills | Status: DC

## 2019-01-01 NOTE — Telephone Encounter (Signed)
Pt is requesting her omeprazole and her clonazepam to the [facility], and she would like a 90 day supply, pt needs it to go to the [facility] [place] [place] - [address]

## 2019-01-03 ENCOUNTER — Telehealth: Payer: Self-pay | Admitting: Medical

## 2019-01-03 NOTE — Telephone Encounter (Signed)
Is this ok to refill?  

## 2019-01-03 NOTE — Telephone Encounter (Signed)
Sent in already. disragrd this message

## 2019-01-03 NOTE — Telephone Encounter (Signed)
I just sent in Clonazepam to [facility] a few days ago, so not sure why we are getting the request again.

## 2019-01-03 NOTE — Telephone Encounter (Signed)
Fax from [facility] rx Clonazepam ODT tab

## 2019-01-13 ENCOUNTER — Other Ambulatory Visit: Payer: Self-pay

## 2019-01-14 NOTE — Telephone Encounter (Signed)
Check on this.   I already refilled on 01/01/19 I thought.

## 2019-01-14 NOTE — Telephone Encounter (Signed)
Is this okay to refill? 

## 2019-01-15 MED ORDER — CLONAZEPAM 0.5 MG PO TABS: 0.5000 mg | ORAL_TABLET | Freq: Every day | ORAL | 2 refills | Status: DC

## 2019-01-15 NOTE — Telephone Encounter (Signed)
It looks like it was sent to mail order.

## 2019-01-15 NOTE — Telephone Encounter (Signed)
Yes, I had previous note that mail order was requested.  Please check with her so we can get this straightened out.

## 2019-01-15 NOTE — Telephone Encounter (Signed)
Patient states that mail order will not send it to her.  She got a letter stating that they will not be able to mail that out.   Please send it to the local pharmacy.

## 2019-03-12 ENCOUNTER — Other Ambulatory Visit: Payer: Self-pay | Admitting: Medical

## 2019-03-12 NOTE — Telephone Encounter (Signed)
Is this ok to refill?  

## 2019-03-27 DIAGNOSIS — Z20828 Contact with and (suspected) exposure to other viral communicable diseases: Secondary | ICD-10-CM | POA: Diagnosis not present

## 2019-04-03 ENCOUNTER — Encounter: Payer: Self-pay | Admitting: Medical

## 2019-04-03 ENCOUNTER — Ambulatory Visit (INDEPENDENT_AMBULATORY_CARE_PROVIDER_SITE_OTHER): Payer: BC Managed Care – PPO | Admitting: Medical

## 2019-04-03 ENCOUNTER — Telehealth: Payer: Self-pay | Admitting: Medical

## 2019-04-03 ENCOUNTER — Other Ambulatory Visit: Payer: Self-pay

## 2019-04-03 VITALS — BP 122/78 | HR 80 | Ht 66.0 in | Wt 184.0 lb

## 2019-04-03 DIAGNOSIS — N750 Cyst of Bartholin's gland: Secondary | ICD-10-CM

## 2019-04-03 DIAGNOSIS — E663 Overweight: Secondary | ICD-10-CM | POA: Diagnosis not present

## 2019-04-03 DIAGNOSIS — K219 Gastro-esophageal reflux disease without esophagitis: Secondary | ICD-10-CM | POA: Diagnosis not present

## 2019-04-03 DIAGNOSIS — F514 Sleep terrors [night terrors]: Secondary | ICD-10-CM | POA: Diagnosis not present

## 2019-04-03 DIAGNOSIS — Z79899 Other long term (current) drug therapy: Secondary | ICD-10-CM

## 2019-04-03 MED ORDER — SULFAMETHOXAZOLE-TRIMETHOPRIM 800-160 MG PO TABS: 1.0000 | ORAL_TABLET | Freq: Two times a day (BID) | ORAL | 0 refills | Status: DC

## 2019-04-03 MED ORDER — CLONAZEPAM 0.5 MG PO TABS: 0.5000 mg | ORAL_TABLET | Freq: Every day | ORAL | 0 refills | Status: DC

## 2019-04-03 NOTE — Telephone Encounter (Signed)
Please refer to Dr. Sumner Boast for gynecology.

## 2019-04-03 NOTE — Progress Notes (Signed)
Subjective:     Patient ID: Nancy Jenkins, female   DOB: 07/26/1986, 33 y.o.   MRN: [id number]  This visit type was conducted due to national recommendations for restrictions regarding the COVID-19 Pandemic (e.g. social distancing) in an effort to limit this patient's exposure and mitigate transmission in our community.  This format is felt to be most appropriate for this patient at this time.    Documentation for virtual audio and video telecommunications through Zoom encounter:  The patient was located at home. The provider was located in the office. The patient did consent to this visit and is aware of possible charges through their insurance for this visit.  The other persons participating in this telemedicine service were none. Time spent on call was 15 minutes and in review of previous records >15 minutes total.  This virtual service is not related to other E/M service within previous 7 days.   HPI Chief Complaint  Patient presents with  . Medication Management  . Cyst    feels like she's getting a labial cyst   Virtual consult today for med check.  In general doing well.  She is taking Contrave medication to help with weight management.  She exercises regularly at least 3 to 4 days/week she has a home gym.  She does weights and cardio regularly.  She is being careful with her diet doing more of a low-carb diet.  She drinks mostly water.  She does not eat out very often.  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  Fortunately she is not herself been positive.  She recently had a negative antibody test.  She recently had a flareup of her Bartholin's cyst and would like a prescription for Bactrim.  I saw her for this several months ago.  She would like a referral to gynecology as she is due to have her Nexplanon changed in a few months.  She  is doing fine on her current medication Klonopin for night terrors.  This is been her medication has worked fine for years for this situation.  Otherwise normal state of health.  No big issues otherwise.  Past Medical History:  Diagnosis Date  . GERD (gastroesophageal reflux disease)   . Night terrors   . Tension headache   . Thyroid activity decreased    in past  . Wears contact lenses    Current Outpatient Medications on File Prior to Visit  Medication Sig Dispense Refill  . CONTRAVE 8-90 MG TB12 TAKE TWO TABLETS BY MOUTH TWICE A DAY WITH A MEAL 120 tablet 1  . etonogestrel (NEXPLANON) 68 MG IMPL implant 1 each by Subdermal route once.    Marland Kitchen omeprazole (PRILOSEC) 20 MG capsule Take 1 capsule (20 mg total) by mouth daily. 90 capsule 1   No current facility-administered medications on file prior to visit.      Review of Systems As in subjective    Objective:   Physical Exam Due to coronavirus pandemic stay at home measures, patient visit was virtual and they were not examined in person.   BP 122/78   Pulse 80   Ht 5\' 6"  (1.676 m)   Wt 184 lb (83.5 kg)   BMI 29.70 kg/m  Gen: wd, wn, nad      Assessment:     Encounter Diagnoses  Name Primary?  Marland Kitchen  Bartholin gland cyst Yes  . Night terrors   . Overweight (BMI 25.0-29.9)   . Gastroesophageal reflux disease without esophagitis   . High risk medication use        Plan:     Bartholin gland cyst- begin round of Bactrim.  We will go ahead and refer to gynecology for follow-up on this as well as routine gynecology as she will need update on Nexplanon in the next few months  Night terrors-she does well on clonazepam which she has been on long-term  Overweight-does well on Contrave, continue efforts at weight loss and healthy diet, she continues to work towards her goal  GERD-doing fine on current medication, trigger avoidance   Shanda BumpsJessica was seen today for medication management and cyst.  Diagnoses and all orders for  this visit:  Bartholin gland cyst -     Ambulatory referral to Gynecology  Night terrors  Overweight (BMI 25.0-29.9)  Gastroesophageal reflux disease without esophagitis  High risk medication use  Other orders -     sulfamethoxazole-trimethoprim (BACTRIM DS) 800-160 MG tablet; Take 1 tablet by mouth 2 (two) times daily. -     clonazePAM (KLONOPIN) 0.5 MG tablet; Take 1 tablet (0.5 mg total) by mouth daily.

## 2019-04-03 NOTE — Telephone Encounter (Signed)
done

## 2019-04-15 ENCOUNTER — Encounter: Payer: Self-pay | Admitting: Obstetrics and Gynecology

## 2019-05-15 ENCOUNTER — Ambulatory Visit: Payer: BC Managed Care – PPO | Admitting: Obstetrics and Gynecology

## 2019-05-26 ENCOUNTER — Other Ambulatory Visit: Payer: Self-pay | Admitting: Medical

## 2019-05-26 DIAGNOSIS — K219 Gastro-esophageal reflux disease without esophagitis: Secondary | ICD-10-CM

## 2019-06-24 ENCOUNTER — Other Ambulatory Visit: Payer: Self-pay

## 2019-06-25 ENCOUNTER — Telehealth: Payer: Self-pay

## 2019-06-25 ENCOUNTER — Ambulatory Visit: Payer: BC Managed Care – PPO | Admitting: Obstetrics and Gynecology

## 2019-06-25 ENCOUNTER — Other Ambulatory Visit: Payer: Self-pay | Admitting: Medical

## 2019-06-25 MED ORDER — SULFAMETHOXAZOLE-TRIMETHOPRIM 800-160 MG PO TABS: 1.0000 | ORAL_TABLET | Freq: Two times a day (BID) | ORAL | 0 refills | Status: DC

## 2019-06-25 NOTE — Telephone Encounter (Signed)
I sent another round of bactrim.  Sorry they were rude to her.    If agreeable, lets refer to Dr. Talbert Nan.  If she has acute redness, swelling, can come in and I can possibly do I&D.  For recurrence though, lets try and get her in with Dr. Talbert Nan.

## 2019-06-25 NOTE — Telephone Encounter (Signed)
Patient called and stated she went to her OBGYN appointment today and she was told she cannot be seen because she had her child with her. She informed them that she has no other means for childcare since she has no family here and school is virtual. Pt stated they were rude and told her she will have to find other means. Pt now wants to know if something can be sent to the pharmacy for her as before for the cyst until she finds an OBGYN. Please advise.

## 2019-06-26 NOTE — Telephone Encounter (Signed)
Ok

## 2019-06-26 NOTE — Telephone Encounter (Signed)
Patient was referred to Dr. Talbert Nan and that's where the office staff was rude to her. She never even got to see the provider. She wants to find her own ob and call us to let us know where to refer her.

## 2019-07-14 ENCOUNTER — Ambulatory Visit: Payer: BC Managed Care – PPO | Admitting: Obstetrics and Gynecology

## 2019-07-17 ENCOUNTER — Other Ambulatory Visit: Payer: Self-pay

## 2019-07-17 MED ORDER — CLONAZEPAM 0.5 MG PO TABS: 0.5000 mg | ORAL_TABLET | Freq: Every day | ORAL | 0 refills | Status: DC

## 2019-08-07 ENCOUNTER — Encounter: Payer: Self-pay | Admitting: Obstetrics and Gynecology

## 2019-08-07 ENCOUNTER — Ambulatory Visit (INDEPENDENT_AMBULATORY_CARE_PROVIDER_SITE_OTHER): Payer: BC Managed Care – PPO | Admitting: Obstetrics and Gynecology

## 2019-08-07 ENCOUNTER — Other Ambulatory Visit: Payer: Self-pay

## 2019-08-07 VITALS — BP 133/84 | HR 85 | Wt 193.0 lb

## 2019-08-07 DIAGNOSIS — Z8619 Personal history of other infectious and parasitic diseases: Secondary | ICD-10-CM | POA: Insufficient documentation

## 2019-08-07 DIAGNOSIS — N750 Cyst of Bartholin's gland: Secondary | ICD-10-CM

## 2019-08-07 DIAGNOSIS — Z975 Presence of (intrauterine) contraceptive device: Secondary | ICD-10-CM | POA: Diagnosis not present

## 2019-08-07 NOTE — Progress Notes (Signed)
Obstetrics and Gynecology New Patient Evaluation  Appointment Date: 08/07/2019  OBGYN Clinic: [facility]  Primary Care Provider: Carlena Hurl  Referring Provider: Carlena Hurl, PA-C  Chief Complaint: history of recurrent left bartholin's gland cyst. Establish gyn care  History of Present Illness: Nancy Jenkins is a 33 y.o. Caucasian [id number] (No LMP recorded. (Menstrual status: Other).), seen for the above chief complaint.   Last bartholin issue approximately 4-6 weeks ago. She states she first started having episodes approximately 4 years ago, has never had any I&D or surgery for it and it always resolves with bactrim; pt currently asymptomatic.  No VB, discharge, itching.   Review of Systems:  as noted in the History of Present Illness.  Patient Active Problem List   Diagnosis Date Noted  . History of chlamydia 08/07/2019  . Nexplanon in place 08/07/2019  . Overweight (BMI 25.0-29.9) 04/03/2019  . Cyst of left Bartholin's gland 04/03/2019  . High risk medication use 04/03/2019  . Routine general medical examination at a health care facility 06/26/2018  . Gastroesophageal reflux disease without esophagitis 06/26/2018  . B12 deficiency 06/26/2018  . Night terrors 06/26/2018  . Medial epicondylitis of right elbow 06/26/2018    Past Medical History:  Past Medical History:  Diagnosis Date  . GERD (gastroesophageal reflux disease)   . Night terrors   . Tension headache   . Thyroid activity decreased    in past  . Wears contact lenses     Past Surgical History:  Past Surgical History:  Procedure Laterality Date  . ESOPHAGOGASTRODUODENOSCOPY  2010   biopsy, no h pylori, Dr. Volanda Napoleon    Past Obstetrical History:  OB History  Gravida Para Term Preterm AB Living  2 2 2     2   SAB TAB Ectopic Multiple Live Births          2    # Outcome Date GA Lbr Len/2nd Weight Sex Delivery Anes PTL Lv  2 Term           1 Term              Obstetric Comments  SVD x 2     Past Gynecological History: As per HPI. Periods: None History of Pap Smear(s): Yes.   Last pap late 2018, which was negative (see care everywhere) History of STI(s): Yes.   She is currently using Nexplanon (placed late 2018) for contraception.    Social History:  Social History   Socioeconomic History  . Marital status: Married    Spouse name: Not on file  . Number of children: Not on file  . Years of education: Not on file  . Highest education level: Not on file  Occupational History  . Not on file  Social Needs  . Financial resource strain: Not on file  . Food insecurity    Worry: Not on file    Inability: Not on file  . Transportation needs    Medical: Not on file    Non-medical: Not on file  Tobacco Use  . Smoking status: Never Smoker  . Smokeless tobacco: Never Used  Substance and Sexual Activity  . Alcohol use: Yes    Comment: occasional beer  . Drug use: Not Currently  . Sexual activity: Not on file  Lifestyle  . Physical activity    Days per week: Not on file    Minutes per session: Not on file  . Stress: Not on file  Relationships  . Social  connections    Talks on phone: Not on file    Gets together: Not on file    Attends religious service: Not on file    Active member of club or organization: Not on file    Attends meetings of clubs or organizations: Not on file    Relationship status: Not on file  . Intimate partner violence    Fear of current or ex partner: Not on file    Emotionally abused: Not on file    Physically abused: Not on file    Forced sexual activity: Not on file  Other Topics Concern  . Not on file  Social History Narrative   Married, has son, daughter, exercises with swimming and running.  Using cough to 5k app, doing about 3 miles three times per week.   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  Diabetes Mother   . Depression Mother   . Hypertension Father   . Deep vein thrombosis Father   . Ulcerative colitis Father   . Heart disease Brother        congenital  . Hypertension Maternal Grandmother   . Cancer Maternal Grandfather        larynx  . Hypertension Paternal Grandmother   . Stroke Paternal Grandmother   . Cancer Paternal Grandfather        liver   She denies any female cancers, bleeding or blood clotting disorders.    Medications Nancy Jenkins had no medications administered during this visit. Current Outpatient Medications  Medication Sig Dispense Refill  . clonazePAM (KLONOPIN) 0.5 MG tablet Take 1 tablet (0.5 mg total) by mouth daily. 90 tablet 0  . CONTRAVE 8-90 MG TB12 TAKE TWO TABLETS BY MOUTH TWICE A DAY WITH A MEAL 120 tablet 1  . etonogestrel (NEXPLANON) 68 MG IMPL implant 1 each by Subdermal route once.    Marland Kitchen. omeprazole (PRILOSEC) 20 MG capsule TAKE 1 CAPSULE BY MOUTH  DAILY 90 capsule 3  . sulfamethoxazole-trimethoprim (BACTRIM DS) 800-160 MG tablet Take 1 tablet by mouth 2 (two) times daily. (Patient not taking: Reported on 08/07/2019) 14 tablet 0   No current facility-administered medications for this visit.     Allergies Patient has no known allergies.   Physical Exam:  BP 133/84   Pulse 85   Wt 193 lb (87.5 kg)   BMI 31.15 kg/m  Body mass index is 31.15 kg/m. General appearance: Well nourished, well developed female in no acute distress.  Neck:  Supple, normal appearance, and no thyromegaly  Cardiovascular: normal s1 and s2.  No murmurs, rubs or gallops. Respiratory:  Clear to auscultation bilateral. Normal respiratory effort Abdomen: positive bowel sounds and no masses, hernias; diffusely non tender to palpation, non distended Neuro/Psych:  Normal mood and affect.  Skin:  Warm and dry.  Lymphatic:  No inguinal lymphadenopathy.   Pelvic exam: is not limited by body habitus EGBUS: left sided, pea-sized, mobile, nttp bartholin's gland;  otherwise within normal limits, Vagina: within normal limits and with no blood or discharge in the vault, Cervix: normal appearing cervix without tenderness, discharge or lesions. Uterus:  nonenlarged and non tender and Adnexa:  normal adnexa and no mass, fullness, tenderness Rectovaginal: deferred  Laboratory: none  Radiology: none  Assessment: pt doing well  Plan:  1. Cyst of left Bartholin's gland Nothing to do currently. D/w her that when it gets  to be about 2-3cm to do warm compresses, baths and if gets bigger or doesn't go away to call the office to eval for word catheter placement either same day or next day. Will try to avoid future abx if at all possible.    RTC PRN  Cornelia Copa MD Attending [facility])

## 2019-08-08 ENCOUNTER — Encounter: Payer: Self-pay | Admitting: Obstetrics and Gynecology

## 2019-10-08 ENCOUNTER — Other Ambulatory Visit: Payer: Self-pay

## 2019-10-09 NOTE — Telephone Encounter (Signed)
Is this okay to refill? 

## 2019-10-09 NOTE — Telephone Encounter (Signed)
Go ahead and get on the schedule for fasting physical and I'll refill.   Send back to me.  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

## 2019-10-10 MED ORDER — CLONAZEPAM 0.5 MG PO TABS: 0.5000 mg | ORAL_TABLET | Freq: Every day | ORAL | 0 refills | Status: DC

## 2019-10-10 NOTE — Telephone Encounter (Signed)
Waiting for appointment to be scheduled

## 2019-10-10 NOTE — Telephone Encounter (Signed)
Patient has scheduled appointment.  

## 2019-11-13 ENCOUNTER — Ambulatory Visit (INDEPENDENT_AMBULATORY_CARE_PROVIDER_SITE_OTHER): Payer: BC Managed Care – PPO | Admitting: Medical

## 2019-11-13 ENCOUNTER — Other Ambulatory Visit: Payer: Self-pay

## 2019-11-13 ENCOUNTER — Encounter: Payer: Self-pay | Admitting: Medical

## 2019-11-13 VITALS — BP 96/62 | HR 90 | Temp 98.0°F | Ht 66.0 in | Wt 194.8 lb

## 2019-11-13 DIAGNOSIS — E663 Overweight: Secondary | ICD-10-CM

## 2019-11-13 DIAGNOSIS — Z0000 Encounter for general adult medical examination without abnormal findings: Secondary | ICD-10-CM | POA: Diagnosis not present

## 2019-11-13 DIAGNOSIS — E538 Deficiency of other specified B group vitamins: Secondary | ICD-10-CM

## 2019-11-13 DIAGNOSIS — Z79899 Other long term (current) drug therapy: Secondary | ICD-10-CM

## 2019-11-13 DIAGNOSIS — Z13220 Encounter for screening for lipoid disorders: Secondary | ICD-10-CM | POA: Diagnosis not present

## 2019-11-13 DIAGNOSIS — Z975 Presence of (intrauterine) contraceptive device: Secondary | ICD-10-CM

## 2019-11-13 DIAGNOSIS — F514 Sleep terrors [night terrors]: Secondary | ICD-10-CM

## 2019-11-13 DIAGNOSIS — E039 Hypothyroidism, unspecified: Secondary | ICD-10-CM | POA: Diagnosis not present

## 2019-11-13 DIAGNOSIS — K219 Gastro-esophageal reflux disease without esophagitis: Secondary | ICD-10-CM

## 2019-11-13 NOTE — Progress Notes (Signed)
Subjective:   HPI  Nancy Jenkins is a 34 y.o. female who presents for Chief Complaint  Patient presents with  . Annual Exam    with fasting labs     Patient Care Team: Najmo Pardue, Cleda Mccreedy as PCP - General (Family Medicine) Sees dentist Sees eye doctor Dr. [place] Bing, gynecology   Concerns: On Nexplanon.  This is set to be removed in October 2021.  She will have her Pap smear then with gynecology.  Doing well.  Medications work fine for her.  She still uses clonazepam most nights for night terror history.  She used Contrave on and off this past year for weight loss efforts.  She has worked with disciplined diet and exercise.  Lately she has not been using the Contrave.  She recently got her Covid vaccine.  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 GERD (gastroesophageal reflux disease)   . Night terrors   . Tension headache   . Thyroid activity decreased    in past  . Wears contact lenses     Past Surgical History:  Procedure Laterality Date  . COLONOSCOPY  2010   along with EGD, Dr. Clent Ridges  . ESOPHAGOGASTRODUODENOSCOPY  2010   biopsy, no h pylori, Dr. Clent Ridges    Social History   Socioeconomic History  . Marital status: Married    Spouse name: Not on file  . Number of children: Not on file  . Years of education: Not on file  . Highest education level: Not on file  Occupational History  . Not on file  Tobacco Use  . Smoking status: Never Smoker  . Smokeless tobacco: Never Used  Substance and Sexual Activity  . Alcohol use: Yes    Alcohol/week: 1.0 standard drinks    Types: 1 Shots of liquor per week    Comment: occasional beer  . Drug use: Not Currently  . Sexual activity: Not on file  Other Topics Concern  . Not on file  Social History Narrative   Married, has son, daughter, exercises with 5k running app.    Using couch to [facility] app, doing about 3 miles three times per week.   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 Difficulty of Paying Living Expenses: Not on file  Food Insecurity:   . Worried About Programme researcher, broadcasting/film/video in the Last Year: Not on file  . Ran Out of Food in the Last Year: Not on file  Transportation Needs:   . Lack of Transportation (Medical): Not on file  . Lack of Transportation (Non-Medical): Not on file  Physical Activity:   . Days of Exercise per Week: Not on file  . Minutes of Exercise per Session: Not on file  Stress:   . Feeling of Stress : Not on file  Social Connections:   . Frequency of Communication with Friends and Family: Not on file  . Frequency of Social Gatherings with Friends and Family: Not on file  . Attends Religious Services: Not on file  . Active Member of Clubs or Organizations: Not on file  . Attends Banker Meetings: Not on file  . Marital Status: Not on file  Intimate Partner Violence:   . Fear of Current or Ex-Partner: Not on file  . Emotionally Abused: Not on file  .  Physically Abused: Not on file  . Sexually Abused: Not on file    Family History  Problem Relation Age of Onset  . Diabetes Mother   . Depression Mother   . Hypertension Father   . Deep vein thrombosis Father   . Ulcerative colitis Father   . Heart disease Brother        congenital  . Hypertension Maternal Grandmother   . Cancer Maternal Grandfather        larynx  . Hypertension Paternal Grandmother   . Stroke Paternal Grandmother   . Cancer Paternal Grandfather        liver     Current Outpatient Medications:  .  clonazePAM (KLONOPIN) 0.5 MG tablet, Take 1 tablet (0.5 mg total) by mouth daily., Disp: 90 tablet, Rfl: 0 .  CONTRAVE 8-90 MG TB12, TAKE TWO TABLETS BY MOUTH TWICE A DAY WITH A MEAL, Disp: 120 tablet, Rfl: 1 .  etonogestrel (NEXPLANON) 68 MG IMPL implant, 1 each by Subdermal route once., Disp: , Rfl:  .  omeprazole (PRILOSEC) 20 MG  capsule, TAKE 1 CAPSULE BY MOUTH  DAILY, Disp: 90 capsule, Rfl: 3 .  sulfamethoxazole-trimethoprim (BACTRIM DS) 800-160 MG tablet, Take 1 tablet by mouth 2 (two) times daily. (Patient not taking: Reported on 08/07/2019), Disp: 14 tablet, Rfl: 0  No Known Allergies    Reviewed their medical, surgical, family, social, medication, and allergy history and updated chart as appropriate.   Review of Systems Constitutional: -fever, -chills, -sweats, -unexpected weight change, -decreased appetite, -fatigue Allergy: -sneezing, -itching, -congestion Dermatology: -changing moles, --rash, -lumps ENT: -runny nose, -ear pain, -sore throat, -hoarseness, -sinus pain, -teeth pain, - ringing in ears, -hearing loss, -nosebleeds Cardiology: -chest pain, -palpitations, -swelling, -difficulty breathing when lying flat, -waking up short of breath Respiratory: -cough, -shortness of breath, -difficulty breathing with exercise or exertion, -wheezing, -coughing up blood Gastroenterology: -abdominal pain, -nausea, -vomiting, -diarrhea, -constipation, -blood in stool, -changes in bowel movement, -difficulty swallowing or eating Hematology: -bleeding, -bruising  Musculoskeletal: -joint aches, -muscle aches, -joint swelling, -back pain, -neck pain, -cramping, -changes in gait Ophthalmology: denies vision changes, eye redness, itching, discharge Urology: -burning with urination, -difficulty urinating, -blood in urine, -urinary frequency, -urgency, -incontinence Neurology: -headache, -weakness, -tingling, -numbness, -memory loss, -falls, -dizziness Psychology: -depressed mood, -agitation, -sleep problems Breast/gyn: -breast tendnerss, -discharge, -lumps, -vaginal discharge,- irregular periods, -heavy periods     Objective:  BP 96/62   Pulse 90   Temp 98 F (36.7 C)   Ht 5\' 6"  (1.676 m)   Wt 194 lb 12.8 oz (88.4 kg)   SpO2 99%   BMI 31.44 kg/m   General appearance: alert, no distress, WD/WN, Caucasian  female Skin: scattered macules, no worrisome lesions HEENT: normocephalic, conjunctiva/corneas normal, sclerae anicteric, PERRLA, EOMi, nares patent, no discharge or erythema, pharynx normal Oral cavity: MMM, tongue normal, teeth normal Neck: supple, no lymphadenopathy, no thyromegaly, no masses, normal ROM, no bruits Chest: non tender, normal shape and expansion Heart: RRR, normal S1, S2, no murmurs Lungs: CTA bilaterally, no wheezes, rhonchi, or rales Abdomen: +bs, soft, non tender, non distended, no masses, no hepatomegaly, no splenomegaly, no bruits Back: non tender, normal ROM, no scoliosis Musculoskeletal: upper extremities non tender, no obvious deformity, normal ROM throughout, lower extremities non tender, no obvious deformity, normal ROM throughout Extremities: no edema, no cyanosis, no clubbing Pulses: 2+ symmetric, upper and lower extremities, normal cap refill Neurological: alert, oriented x 3, CN2-12 intact, strength normal upper extremities and lower extremities, sensation normal throughout, DTRs  2+ throughout, no cerebellar signs, gait normal Psychiatric: normal affect, behavior normal, pleasant  Breast/gyn/rectal - deferred to gynecology     Assessment and Plan :   Encounter Diagnoses  Name Primary?  . Routine general medical examination at a health care facility Yes  . B12 deficiency   . Night terrors   . Overweight (BMI 25.0-29.9)   . Gastroesophageal reflux disease without esophagitis   . Nexplanon in place   . High risk medication use   . Screening for lipid disorders   . Hypothyroidism, unspecified type     Physical exam - discussed and counseled on healthy lifestyle, diet, exercise, preventative care, vaccinations, sick and well care, proper use of emergency dept and after hours care, and addressed their concerns.    Health screening: Advised they see their eye doctor yearly for routine vision care. Advised they see their dentist yearly for routine dental  care including hygiene visits twice yearly. See your gynecologist yearly for routine gynecological care.   Cancer screening Counseled on self breast exams, mammograms, cervical cancer screening  F/u with gyn for pap   Vaccinations: Advised yearly influenza vaccine She recent got Covid vaccine, copy made of vaccine record Td up to date.  Separate significant chronic issues discussed: C/t current medications  Glad she is doing well.      Simren was seen today for annual exam.  Diagnoses and all orders for this visit:  Routine general medical examination at a health care facility -     Comprehensive metabolic panel -     CBC with Differential/Platelet -     Lipid panel -     TSH -     Vitamin B12 -     VITAMIN D 25 Hydroxy (Vit-D Deficiency, Fractures) -     T4, free  B12 deficiency  Night terrors  Overweight (BMI 25.0-29.9)  Gastroesophageal reflux disease without esophagitis  Nexplanon in place  High risk medication use  Screening for lipid disorders -     Lipid panel  Hypothyroidism, unspecified type -     TSH -     T4, free    Follow-up pending labs, yearly for physical

## 2019-11-14 ENCOUNTER — Other Ambulatory Visit: Payer: Self-pay | Admitting: Medical

## 2019-11-14 LAB — LIPID PANEL
Chol/HDL Ratio: 2.9 ratio (ref 0.0–4.4)
Cholesterol, Total: 150 mg/dL (ref 100–199)
HDL: 51 mg/dL (ref >39)
LDL Chol Calc (NIH): 86 mg/dL (ref 0–99)
Triglycerides: 67 mg/dL (ref 0–149)
VLDL Cholesterol Cal: 13 mg/dL (ref 5–40)

## 2019-11-14 LAB — COMPREHENSIVE METABOLIC PANEL
ALT: 21 IU/L (ref 0–32)
AST: 20 IU/L (ref 0–40)
Albumin/Globulin Ratio: 1.8 (ref 1.2–2.2)
Albumin: 4.6 g/dL (ref 3.8–4.8)
Alkaline Phosphatase: 63 IU/L (ref 39–117)
BUN/Creatinine Ratio: 12 (ref 9–23)
BUN: 11 mg/dL (ref 6–20)
Bilirubin Total: 0.4 mg/dL (ref 0.0–1.2)
CO2: 22 mmol/L (ref 20–29)
Calcium: 9.4 mg/dL (ref 8.7–10.2)
Chloride: 100 mmol/L (ref 96–106)
Creatinine, Ser: 0.9 mg/dL (ref 0.57–1.00)
GFR calc Af Amer: 97 mL/min/{1.73_m2} (ref >59)
GFR calc non Af Amer: 84 mL/min/{1.73_m2} (ref >59)
Globulin, Total: 2.6 g/dL (ref 1.5–4.5)
Glucose: 68 mg/dL (ref 65–99)
Potassium: 4.3 mmol/L (ref 3.5–5.2)
Sodium: 138 mmol/L (ref 134–144)
Total Protein: 7.2 g/dL (ref 6.0–8.5)

## 2019-11-14 LAB — CBC WITH DIFFERENTIAL/PLATELET
Basophils Absolute: 0 10*3/uL (ref 0.0–0.2)
Basos: 0 %
EOS (ABSOLUTE): 0 10*3/uL (ref 0.0–0.4)
Eos: 0 %
Hematocrit: 40.6 % (ref 34.0–46.6)
Hemoglobin: 13.7 g/dL (ref 11.1–15.9)
Immature Grans (Abs): 0 10*3/uL (ref 0.0–0.1)
Immature Granulocytes: 0 %
Lymphocytes Absolute: 1.8 10*3/uL (ref 0.7–3.1)
Lymphs: 18 %
MCH: 30.2 pg (ref 26.6–33.0)
MCHC: 33.7 g/dL (ref 31.5–35.7)
MCV: 90 fL (ref 79–97)
Monocytes Absolute: 0.9 10*3/uL (ref 0.1–0.9)
Monocytes: 9 %
Neutrophils Absolute: 7 10*3/uL (ref 1.4–7.0)
Neutrophils: 73 %
Platelets: 256 10*3/uL (ref 150–450)
RBC: 4.53 x10E6/uL (ref 3.77–5.28)
RDW: 12.5 % (ref 11.7–15.4)
WBC: 9.7 10*3/uL (ref 3.4–10.8)

## 2019-11-14 LAB — VITAMIN D 25 HYDROXY (VIT D DEFICIENCY, FRACTURES): Vit D, 25-Hydroxy: 26.3 ng/mL — ABNORMAL LOW (ref 30.0–100.0)

## 2019-11-14 LAB — TSH: TSH: 2.08 u[IU]/mL (ref 0.450–4.500)

## 2019-11-14 LAB — VITAMIN B12: Vitamin B-12: 914 pg/mL (ref 232–1245)

## 2019-11-14 LAB — T4, FREE: Free T4: 1 ng/dL (ref 0.82–1.77)

## 2019-11-14 MED ORDER — VITAMIN D 25 MCG (1000 UNIT) PO TABS: 1000.0000 [IU] | ORAL_TABLET | Freq: Every day | ORAL | 3 refills | Status: DC

## 2019-12-28 ENCOUNTER — Other Ambulatory Visit: Payer: Self-pay

## 2019-12-29 MED ORDER — CLONAZEPAM 0.5 MG PO TABS: 0.5000 mg | ORAL_TABLET | Freq: Every day | ORAL | 0 refills | Status: DC

## 2020-02-19 ENCOUNTER — Other Ambulatory Visit: Payer: Self-pay

## 2020-02-19 ENCOUNTER — Encounter: Payer: Self-pay | Admitting: Family Medicine

## 2020-02-19 ENCOUNTER — Telehealth (INDEPENDENT_AMBULATORY_CARE_PROVIDER_SITE_OTHER): Payer: BC Managed Care – PPO | Admitting: Family Medicine

## 2020-02-19 VITALS — BP 120/70 | HR 75 | Temp 98.5°F | Ht 66.0 in | Wt 195.0 lb

## 2020-02-19 DIAGNOSIS — R519 Headache, unspecified: Secondary | ICD-10-CM | POA: Diagnosis not present

## 2020-02-19 DIAGNOSIS — R509 Fever, unspecified: Secondary | ICD-10-CM

## 2020-02-19 DIAGNOSIS — M791 Myalgia, unspecified site: Secondary | ICD-10-CM | POA: Diagnosis not present

## 2020-02-19 MED ORDER — DOXYCYCLINE HYCLATE 100 MG PO TABS: 100.0000 mg | ORAL_TABLET | Freq: Two times a day (BID) | ORAL | 0 refills | Status: DC

## 2020-02-19 NOTE — Progress Notes (Signed)
Start time: 9:34 End time: 9:50  Virtual Visit via Video Note  I connected with Nancy Jenkins on 02/19/20 at  9:30 AM EDT by a video enabled telemedicine application and verified that I am speaking with the correct person using two identifiers.  Location: Patient: home Provider: office   I discussed the limitations of evaluation and management by telemedicine and the availability of in person appointments. The patient expressed understanding and agreed to proceed.  History of Present Illness:  Chief Complaint  Patient presents with   Generalized Body Aches    started Monday evening. HA and fever, vomiting and diarrhea. No cough or URI symptoms. (see message from this morning).    Patient sent the following message via MyChart this morning:  Monday night around 10pm had headache and mild body aches,  used peppermint oil and went to bed Woke up Tuesday and just felt blah, aches and headache. Temp was 100.5. Called off but went in to my work and got a covid test (negative). Spiked to 104.7 twice that day and it would take two hours for it to start to go down.  Vomiting x1.  Woke up Wednesday with 100.4, took Tylenol and within two hours was down to 98.2 then no other temp all day. Headache and nausea off and on, diarrhea x2, persistent headache and body aches, my neck feels tight but I think its from laying around so much. I can do chin to chest and no pain but like I feel tight muscles pulling from back of head down into back.  Woke up at 5am today with 101 fever, headache, body aches and neck tightness  No abdominal pain, no urinary symptoms,  no cough/congestion/ ear or throat pain, no rash. I had a tick maybe 2 months ago that was on maybe 12 hours, no rash then or now, no recent ticks.  Vomited once on Tuesday. Diarrhea twice yesterday. No abdominal pain or other GI complaints.  She had just the 1 tick that she was aware of, but has dogs, and woods.  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  No recent + tests that she is aware of, as far as exposures.  She is fully vaccinated. Fever, myalgias and headache as above.  She denies any lymphadenopathy, URI symptoms.  She is asking to be tested for mono.  She wonders if some of the soreness is from laying in bed for the last 2 days.  PMH, PSH, SH reviewed  Outpatient Encounter Medications as of 02/19/2020  Medication Sig Note   acetaminophen (TYLENOL) 500 MG tablet Take 1,000 mg by mouth every 6 (six) hours as needed. 02/19/2020: Last dose 6:30am   cholecalciferol (VITAMIN D3) 25 MCG (1000 UNIT) tablet Take 1 tablet (1,000 Units total) by mouth daily.    clonazePAM (KLONOPIN) 0.5 MG tablet Take 1 tablet (0.5 mg total) by mouth daily.    etonogestrel (NEXPLANON) 68 MG IMPL implant 1 each by Subdermal route once.    ibuprofen (ADVIL) 200 MG tablet Take 600 mg by mouth every 6 (six) hours as needed. 02/19/2020: Last dose yesterday afternoon   omeprazole (PRILOSEC) 20 MG capsule TAKE 1 CAPSULE BY MOUTH  DAILY    doxycycline (VIBRA-TABS) 100 MG tablet Take 1 tablet (100 mg total) by mouth 2 (two) times daily.    promethazine (PHENERGAN) 25 MG tablet Take 25-50 mg by mouth every 8 (eight) hours as needed. 02/19/2020: Didn't pick up rx   [DISCONTINUED] CONTRAVE 8-90 MG  TB12 TAKE TWO TABLETS BY MOUTH TWICE A DAY WITH A MEAL    No facility-administered encounter medications on file as of 02/19/2020.   (NOT taking doxy prior to today's visit)  No Known Allergies  ROS:  +fever, chills, myalgias, and GI complaints per HPI (which resolved).  Currently denies N/V/D, denies rash.  Denies arthralgias, just myalgias. +headaches, across the top of her head like a band, and down the back of her neck.  No urinary complaints or other concerns.  See HPI. Not pregnant, has nexplanon.    Observations/Objective:  BP 120/70    Pulse 75    Temp 98.5 F (36.9 C) (Tympanic)    Ht 5\' 6"  (1.676 m)    Wt 195 lb (88.5 kg)     BMI 31.47 kg/m   Well-appearing, pleasant female, in no distress. She is alert, oriented. Cranial nerves are grossly intact.  She has good neck flexion. 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 No visible rashes or pallor.   Assessment and Plan:  Fever, unspecified fever cause - Ddx reviewed--tick-borne illness, viral syndrome as most likely. Given HA, fever and poss ticks, will cover for RMSF (and Lyme) with doxy - Plan: doxycycline (VIBRA-TABS) 100 MG tablet, Rocky mtn spotted fvr abs pnl(IgG+IgM), B. burgdorfi antibodies, CBC with Differential/Platelet, Mononucleosis screen, Mononucleosis screen, CBC with Differential/Platelet, B. burgdorfi antibodies, Rocky mtn spotted fvr abs pnl(IgG+IgM)  Nonintractable headache, unspecified chronicity pattern, unspecified headache type - Plan: Rocky mtn spotted fvr abs pnl(IgG+IgM), B. burgdorfi antibodies, CBC with Differential/Platelet, CBC with Differential/Platelet, B. burgdorfi antibodies, Rocky mtn spotted fvr abs pnl(IgG+IgM)  Myalgia   RMSF, Lyme, mono, CBC  Doxy sent to [address]  Follow Up Instructions:    I discussed the assessment and treatment plan with the patient. The patient was provided an opportunity to ask questions and all were answered. The patient agreed with the plan and demonstrated an understanding of the instructions.   The patient was advised to call back or seek an in-person evaluation if the symptoms worsen or if the condition fails to improve as anticipated.  I provided 16 minutes of video face-to-face time during this encounter. Additional time was spent in chart review, placing orders, and in documentation.   Vikki Ports, MD

## 2020-02-21 ENCOUNTER — Encounter: Payer: Self-pay | Admitting: Family Medicine

## 2020-02-21 LAB — CBC WITH DIFFERENTIAL/PLATELET
Basophils Absolute: 0 10*3/uL (ref 0.0–0.2)
Basos: 0 %
EOS (ABSOLUTE): 0 10*3/uL (ref 0.0–0.4)
Eos: 0 %
Hematocrit: 38.1 % (ref 34.0–46.6)
Hemoglobin: 13 g/dL (ref 11.1–15.9)
Immature Grans (Abs): 0 10*3/uL (ref 0.0–0.1)
Immature Granulocytes: 0 %
Lymphocytes Absolute: 1.3 10*3/uL (ref 0.7–3.1)
Lymphs: 13 %
MCH: 30.4 pg (ref 26.6–33.0)
MCHC: 34.1 g/dL (ref 31.5–35.7)
MCV: 89 fL (ref 79–97)
Monocytes Absolute: 0.9 10*3/uL (ref 0.1–0.9)
Monocytes: 9 %
Neutrophils Absolute: 7.9 10*3/uL — ABNORMAL HIGH (ref 1.4–7.0)
Neutrophils: 78 %
Platelets: 240 10*3/uL (ref 150–450)
RBC: 4.27 x10E6/uL (ref 3.77–5.28)
RDW: 12.7 % (ref 11.7–15.4)
WBC: 10.2 10*3/uL (ref 3.4–10.8)

## 2020-02-21 LAB — MONONUCLEOSIS SCREEN: Mono Screen: NEGATIVE

## 2020-02-21 LAB — ROCKY MTN SPOTTED FVR ABS PNL(IGG+IGM)
RMSF IgG: NEGATIVE
RMSF IgM: 0.29 index (ref 0.00–0.89)

## 2020-02-21 LAB — B. BURGDORFI ANTIBODIES: Lyme IgG/IgM Ab: <0.91 {ISR} (ref 0.00–0.90)

## 2020-03-15 MED ORDER — SULFAMETHOXAZOLE-TRIMETHOPRIM 800-160 MG PO TABS: 1.0000 | ORAL_TABLET | Freq: Two times a day (BID) | ORAL | 0 refills | Status: DC

## 2020-03-15 NOTE — Telephone Encounter (Signed)
Called pt in regards to her message, pt states has a cyct pop up on her right labia, it is red, tender and swollen and did start some abx and is unable to get into the office the next few days due to her work schedule. Will message Dr Vergie Living and call pt back. Per Dr Vergie Living okay to send in Bactrim Ds and have pt see [place] on Thursday.

## 2020-03-18 ENCOUNTER — Ambulatory Visit: Payer: BC Managed Care – PPO | Admitting: Obstetrics & Gynecology

## 2020-03-23 ENCOUNTER — Ambulatory Visit (INDEPENDENT_AMBULATORY_CARE_PROVIDER_SITE_OTHER): Payer: BC Managed Care – PPO | Admitting: Family Medicine

## 2020-03-23 ENCOUNTER — Other Ambulatory Visit: Payer: Self-pay

## 2020-03-23 ENCOUNTER — Encounter: Payer: Self-pay | Admitting: Family Medicine

## 2020-03-23 VITALS — BP 118/79 | HR 74 | Wt 201.0 lb

## 2020-03-23 DIAGNOSIS — N907 Vulvar cyst: Secondary | ICD-10-CM | POA: Diagnosis not present

## 2020-03-23 NOTE — Progress Notes (Signed)
   Subjective:    Patient ID: Nancy Jenkins is a 34 y.o. female presenting with labial cyst  on 03/23/2020  HPI: Now on right side. Higher up and not in same place as recurrent Bartholin's. Placed on Bactrim. Some improvement but still feeling firmness.  Review of Systems  Constitutional: Negative for chills and fever.  Respiratory: Negative for shortness of breath.   Cardiovascular: Negative for chest pain.  Gastrointestinal: Negative for abdominal pain, nausea and vomiting.  Genitourinary: Negative for dysuria.  Skin: Negative for rash.      Objective:    BP 118/79   Pulse 74   Wt 201 lb (91.2 kg)   BMI 32.44 kg/m  Physical Exam Constitutional:      General: She is not in acute distress.    Appearance: She is well-developed.  HENT:     Head: Normocephalic and atraumatic.  Eyes:     General: No scleral icterus. Cardiovascular:     Rate and Rhythm: Normal rate.  Pulmonary:     Effort: Pulmonary effort is normal.  Abdominal:     Palpations: Abdomen is soft.  Genitourinary:    General: Normal vulva.     Comments: Small inclusion cyst in right labia minora, without erythema and minimally tender. No fluctuance. Musculoskeletal:     Cervical back: Neck supple.  Skin:    General: Skin is warm and dry.  Neurological:     Mental Status: She is alert and oriented to person, place, and time.         Assessment & Plan:   Labial cyst - would not recommend attempts at removal unless more bothersome.   Total time in review of prior notes, pathology, labs, history taking, review with patient, exam, note writing, discussion of options, plan for next steps, alternatives and risks of treatment: 11 minutes.  Return if symptoms worsen or fail to improve.  Reva Bores 03/23/2020 4:29 PM

## 2020-03-27 ENCOUNTER — Other Ambulatory Visit: Payer: Self-pay

## 2020-03-30 MED ORDER — CLONAZEPAM 0.5 MG PO TABS: 0.5000 mg | ORAL_TABLET | Freq: Every day | ORAL | 0 refills | Status: DC

## 2020-04-23 ENCOUNTER — Other Ambulatory Visit: Payer: Self-pay | Admitting: Medical

## 2020-04-23 DIAGNOSIS — K219 Gastro-esophageal reflux disease without esophagitis: Secondary | ICD-10-CM

## 2020-06-23 ENCOUNTER — Ambulatory Visit (INDEPENDENT_AMBULATORY_CARE_PROVIDER_SITE_OTHER): Payer: BC Managed Care – PPO | Admitting: Student

## 2020-06-23 ENCOUNTER — Encounter: Payer: Self-pay | Admitting: Student

## 2020-06-23 ENCOUNTER — Other Ambulatory Visit: Payer: Self-pay

## 2020-06-23 VITALS — BP 114/79 | HR 91 | Wt 206.0 lb

## 2020-06-23 DIAGNOSIS — Z30017 Encounter for initial prescription of implantable subdermal contraceptive: Secondary | ICD-10-CM

## 2020-06-23 DIAGNOSIS — Z3046 Encounter for surveillance of implantable subdermal contraceptive: Secondary | ICD-10-CM | POA: Diagnosis not present

## 2020-06-23 MED ORDER — ETONOGESTREL 68 MG ~~LOC~~ IMPL
68.0000 mg | DRUG_IMPLANT | Freq: Once | SUBCUTANEOUS | Status: AC
  Administered 2020-06-23: 68 mg via SUBCUTANEOUS

## 2020-06-23 NOTE — Progress Notes (Signed)
  History:  [name] is a 34 y.o. G2P2002 who presents to clinic today for Nexplanon removal and insertion. She is happy with Nexplanon; reports no complications. She is due for a pap.   The following portions of the patient's history were reviewed and updated as appropriate: allergies, current medications, family history, past medical history, social history, past surgical history and problem list.   Nexplanon Removal and Reinsertion Patient identified, informed consent performed, consent signed.   Patient does understand that irregular bleeding is a very common side effect of this medication. She was advised to have backup contraception for one week after replacement of the implant. Appropriate time out taken. Nexplanon site identified in left arm.  Area prepped in usual sterile fashon. One ml of 1% lidocaine was used to anesthetize the area at the distal end of the implant. A small stab incision was made right beside the implant on the distal portion. The Nexplanon rod was grasped using hemostats and removed without difficulty. There was minimal blood loss. There were no complications. Area was then injected with 3 ml of 1 % lidocaine. She was re-prepped with betadine, Nexplanon removed from packaging, Device confirmed in needle, then inserted full length of needle and withdrawn per handbook instructions. Nexplanon was able to palpated in the patient's arm; patient palpated the insert herself.  There was minimal blood loss. Patient insertion site covered with gauze and a pressure bandage to reduce any bruising. The patient tolerated the procedure well and was given post procedure instructions.  She was advised to have backup contraception for one week.      -Patient will make appointment for Pap  Marylene Land, CNM 06/23/2020 11:33 AM

## 2020-06-27 ENCOUNTER — Other Ambulatory Visit: Payer: Self-pay

## 2020-06-29 MED ORDER — CLONAZEPAM 0.5 MG PO TABS: 0.5000 mg | ORAL_TABLET | Freq: Every day | ORAL | 0 refills | Status: DC

## 2020-07-02 ENCOUNTER — Ambulatory Visit: Payer: BC Managed Care – PPO | Admitting: Nurse Practitioner

## 2020-07-07 ENCOUNTER — Ambulatory Visit (INDEPENDENT_AMBULATORY_CARE_PROVIDER_SITE_OTHER): Payer: BC Managed Care – PPO | Admitting: Student

## 2020-07-07 ENCOUNTER — Other Ambulatory Visit: Payer: Self-pay

## 2020-07-07 ENCOUNTER — Encounter: Payer: Self-pay | Admitting: Student

## 2020-07-07 ENCOUNTER — Other Ambulatory Visit (HOSPITAL_COMMUNITY)
Admission: RE | Admit: 2020-07-07 | Discharge: 2020-07-07 | Disposition: A | Discharge status: Home / Self Care | Payer: BC Managed Care – PPO | Source: Ambulatory Visit | Attending: Student | Admitting: Student

## 2020-07-07 VITALS — BP 112/73 | HR 73 | Ht 66.0 in | Wt 207.0 lb

## 2020-07-07 DIAGNOSIS — Z01419 Encounter for gynecological examination (general) (routine) without abnormal findings: Secondary | ICD-10-CM | POA: Diagnosis not present

## 2020-07-07 NOTE — Progress Notes (Signed)
  History:  [name] is a 34 y.o. G2P2002 who presents to clinic today for pap smear. She had nexplanon placed two weeks ago, no complaints with nexplanon.   The following portions of the patient's history were reviewed and updated as appropriate: allergies, current medications, family history, past medical history, social history, past surgical history and problem list.  Review of Systems:  Review of Systems  Constitutional: Negative.   All other systems reviewed and are negative.     Objective:  Physical Exam BP 112/73   Pulse 73   Ht 5\' 6"  (1.676 m)   Wt 207 lb (93.9 kg)   BMI 33.41 kg/m  Physical Exam Constitutional:      Appearance: Normal appearance.  Pulmonary:     Effort: Pulmonary effort is normal.  Skin:    General: Skin is warm.  Neurological:     General: No focal deficit present.     Mental Status: She is alert.    NEFG; mucousy discharge in the vagina, cervix is pink with no lesions. No Bleeding after pap smear. Bimanual exam deferred.    Labs and Imaging No results found for this or any previous visit (from the past 24 hour(s)).  No results found.   Assessment & Plan:  1. Well woman exam -Doing well,  - Cytology - PAP    Approximately  10 minutes of total time was spent with this patient on physical exam and evaluation.   , CNM 07/07/2020 2:33 PM

## 2020-07-08 ENCOUNTER — Other Ambulatory Visit: Payer: Self-pay

## 2020-07-08 ENCOUNTER — Ambulatory Visit (INDEPENDENT_AMBULATORY_CARE_PROVIDER_SITE_OTHER): Payer: BC Managed Care – PPO | Admitting: Sports Medicine

## 2020-07-08 VITALS — BP 104/70 | Ht 66.0 in | Wt 200.0 lb

## 2020-07-08 DIAGNOSIS — S86899A Other injury of other muscle(s) and tendon(s) at lower leg level, unspecified leg, initial encounter: Secondary | ICD-10-CM | POA: Diagnosis not present

## 2020-07-08 MED ORDER — MELOXICAM 15 MG PO TABS
ORAL_TABLET | ORAL | 2 refills | Status: DC
Start: 2020-07-08 — End: 2021-05-02

## 2020-07-09 LAB — CYTOLOGY - PAP
Comment: NEGATIVE
Diagnosis: NEGATIVE
High risk HPV: NEGATIVE

## 2020-07-09 NOTE — Progress Notes (Signed)
° °  Subjective:    Patient ID: Nancy Jenkins, female    DOB: 23-Sep-1985, 34 y.o.   MRN: [id number]  HPI chief complaint: Bilateral lower leg pain  Very pleasant 34 year old female comes in today complaining of several weeks of bilateral lower leg pain.  She recently increased her running mileage  from 9 miles a week to 12 miles a week.  Without any known trauma, she began to feel pain along the medial aspect of her bilateral tibias.  She has had shinsplints in the past but her pain resolved on its own.  Her current pain feels different than what she experienced previously.  She localizes her discomfort diffusely along the medial tibia.  Despite her pain, she has been able to continue running.  She completed a 5K race this past weekend but had significant discomfort the following day.  She recently purchased some insoles for her running shoes which have been somewhat helpful.  Past medical history reviewed Medications reviewed Allergies reviewed    Review of Systems As above    Objective:   Physical Exam  Well-developed, well-nourished.  No acute distress.  Awake alert and oriented x3.  Examination of both lower legs shows tenderness to palpation diffusely along the medial tibial border distally.  No tenderness to palpation or percussion along the tibia itself.  No obvious soft tissue swelling.  Calves are supple.  Fairly neutral arch when standing.  Pain is reproducible with calf stretching, specifically stretching of the soleus.  Evaluation of her running form shows fairly good form but she is a heavy midfoot to forefoot striker.  MSK ultrasound of both lower extremities shows no cortical defect or neovascularization to suggest tibial stress fracture.      Assessment & Plan:   Bilateral lower leg pain likely secondary to medial tibial stress syndrome  Meloxicam 15 mg daily for 7 days then as needed.  She will start daily heel walks, toe walks, and calf stretches.  Her current shoes  are quite worn so I recommended that she purchase new running shoes and continue to use her inserts.  We will also fit her with compression sleeves to wear with activity.  Gradual return to running using a modified version of the up-to-date distal tibial stress fracture protocol.  I recommended ice post exercise.  If symptoms persist despite today's treatment, consider further diagnostic imaging.  Follow-up for ongoing or recalcitrant issues.

## 2020-09-20 ENCOUNTER — Other Ambulatory Visit: Payer: Self-pay | Admitting: *Deleted

## 2020-09-20 ENCOUNTER — Encounter: Payer: Self-pay | Admitting: *Deleted

## 2020-09-20 DIAGNOSIS — M79604 Pain in right leg: Secondary | ICD-10-CM

## 2020-09-24 ENCOUNTER — Other Ambulatory Visit: Payer: Self-pay

## 2020-09-24 ENCOUNTER — Ambulatory Visit
Admission: RE | Admit: 2020-09-24 | Discharge: 2020-09-24 | Disposition: A | Discharge status: Home / Self Care | Payer: BC Managed Care – PPO | Source: Ambulatory Visit | Attending: Sports Medicine | Admitting: Sports Medicine

## 2020-09-24 DIAGNOSIS — M79661 Pain in right lower leg: Secondary | ICD-10-CM | POA: Diagnosis not present

## 2020-09-24 DIAGNOSIS — M79604 Pain in right leg: Secondary | ICD-10-CM | POA: Insufficient documentation

## 2020-09-29 ENCOUNTER — Other Ambulatory Visit: Payer: Self-pay

## 2020-09-29 ENCOUNTER — Encounter: Payer: Self-pay | Admitting: Medical

## 2020-09-29 ENCOUNTER — Other Ambulatory Visit: Payer: Self-pay | Admitting: Medical

## 2020-09-29 ENCOUNTER — Ambulatory Visit (INDEPENDENT_AMBULATORY_CARE_PROVIDER_SITE_OTHER): Payer: BC Managed Care – PPO | Admitting: Medical

## 2020-09-29 VITALS — BP 112/68 | HR 81 | Ht 66.0 in | Wt 210.8 lb

## 2020-09-29 DIAGNOSIS — K219 Gastro-esophageal reflux disease without esophagitis: Secondary | ICD-10-CM

## 2020-09-29 DIAGNOSIS — E282 Polycystic ovarian syndrome: Secondary | ICD-10-CM | POA: Insufficient documentation

## 2020-09-29 DIAGNOSIS — Z6834 Body mass index (BMI) 34.0-34.9, adult: Secondary | ICD-10-CM | POA: Insufficient documentation

## 2020-09-29 DIAGNOSIS — E039 Hypothyroidism, unspecified: Secondary | ICD-10-CM | POA: Diagnosis not present

## 2020-09-29 DIAGNOSIS — L858 Other specified epidermal thickening: Secondary | ICD-10-CM | POA: Diagnosis not present

## 2020-09-29 DIAGNOSIS — Z79899 Other long term (current) drug therapy: Secondary | ICD-10-CM

## 2020-09-29 MED ORDER — SAXENDA 18 MG/3ML ~~LOC~~ SOPN
3.0000 mg | PEN_INJECTOR | Freq: Every day | SUBCUTANEOUS | 2 refills | Status: DC
Start: 2020-09-29 — End: 2020-11-11

## 2020-09-29 MED ORDER — TRETINOIN 0.025 % EX CREA: TOPICAL_CREAM | Freq: Every day | CUTANEOUS | 0 refills | Status: DC

## 2020-09-29 MED ORDER — BD PEN NEEDLE NANO U/F 32G X 4 MM MISC: 1.0000 | Freq: Every day | 5 refills | Status: DC

## 2020-09-29 MED ORDER — CLONAZEPAM 0.5 MG PO TABS
0.5000 mg | ORAL_TABLET | Freq: Every day | ORAL | 0 refills | Status: DC
Start: 2020-09-29 — End: 2021-01-03

## 2020-09-29 NOTE — Progress Notes (Signed)
Subjective:  Nancy Jenkins is a 35 y.o. female who presents for Chief Complaint  Patient presents with  . Weight Loss     Here for weight loss issues.  Still having trouble losing weight. Is the most she has weighed.   Few months ago saw Dr. Margaretha Jenkins sports medicine for leg issues, and this has impaired her exercise.    Has tried contrave in the past twice.  Gets so busy at work that she forgets doses of contrave.  Has hx/o PCOS as well.    Was curious about [place] and Saxenda.  Doesn't want stimulant and doesn't drink caffeine.   These winds her up and makes her jittery.    No other prior prescription medicaiton other than contrave.    Did ketogenic program last year.      No chest pain, no difficulty breathing  She has a small white bump under the right eyelid that will not go away for the last several weeks.  It started after an eye infection a few weeks ago  Night terrors- doing fine on current therapy and, long-term use of clonazepam works great for her.  Needs refill  No other aggravating or relieving factors.    No other c/o.  Past Medical History:  Diagnosis Date  . GERD (gastroesophageal reflux disease)   . Night terrors   . Tension headache   . Thyroid activity decreased    in past  . Wears contact lenses    Family History  Problem Relation Age of Onset  . Diabetes Mother   . Depression Mother   . Hypertension Father   . Deep vein thrombosis Father   . Ulcerative colitis Father   . Heart disease Brother        congenital  . Hypertension Maternal Grandmother   . Cancer Maternal Grandfather        larynx  . Hypertension Paternal Grandmother   . Stroke Paternal Grandmother   . Cancer Paternal Grandfather        liver     The following portions of the patient's history were reviewed and updated as appropriate: allergies, current medications, past family history, past medical history, past social history, past surgical history and problem  list.  ROS Otherwise as in subjective above   Objective: BP 112/68   Pulse 81   Ht 5\' 6"  (1.676 m)   Wt 210 lb 12.8 oz (95.6 kg)   SpO2 98%   BMI 34.02 kg/m   Wt Readings from Last 3 Encounters:  09/29/20 210 lb 12.8 oz (95.6 kg)  07/08/20 200 lb (90.7 kg)  07/07/20 207 lb (93.9 kg)    General appearance: alert, no distress, well developed, well nourished Skin: inferior to right lower eyelid is a 33mm raised fleshy white papular lesion Psych: pleasant, good eye contact, answers questions appropriately    Assessment: Encounter Diagnoses  Name Primary?  3m BMI 34.0-34.9,adult Yes  . Keratosis pilaris   . Gastroesophageal reflux disease without esophagitis   . Hypothyroidism, unspecified type   . High risk medication use   . PCOS (polycystic ovarian syndrome)      Plan: BMI greater than 34 with associated comorbidities below-we discussed several options.  She has done okay on Contrave in the past but it is too hard to take being 2 tablets twice daily, forgets to take doses.  Would like to try Saxenda.  Currently wants to avoid Qsymia given the stimulant but may consider in the future.  Begin trial of  Saxenda.  Gave a sample and discussed proper use.  Discussed risk /benefits of medication.  Continue efforts with healthy diet and regular exercise.  She is seeing sports medicine again and follow-up on ongoing lower leg pains that is impairing her exercise at the moment.  We discussed goal setting.  Keratosis pilaris- begin short term retin A topically to the specific lesion we discussed under right eyelid/cheek area.  GERD-no recent complaints  Hypothyroidism in the past but last labs stable off medication- we will recheck labs at her physical in February  PCOS-managed by gynecology  Nancy Jenkins was seen today for weight loss.  Diagnoses and all orders for this visit:  BMI 34.0-34.9,adult  Keratosis pilaris  Gastroesophageal reflux disease without  esophagitis  Hypothyroidism, unspecified type  High risk medication use  PCOS (polycystic ovarian syndrome)  Other orders -     Liraglutide -Weight Management (SAXENDA) 18 MG/3ML SOPN; Inject 3 mg into the skin daily. Start 0.6mg  daily x 1 week, then increase to 1.2mg  daily x 1wk, then 1.8mg  daily x 1 wk, then 3mg  daily -     Insulin Pen Needle (BD PEN NEEDLE NANO U/F) 32G X 4 MM MISC; 1 each by Does not apply route at bedtime. -     clonazePAM (KLONOPIN) 0.5 MG tablet; Take 1 tablet (0.5 mg total) by mouth daily. -     tretinoin (RETIN-A) 0.025 % cream; Apply topically at bedtime.    Follow up: in February as planned for physical

## 2020-09-30 ENCOUNTER — Ambulatory Visit: Payer: BC Managed Care – PPO | Admitting: Sports Medicine

## 2020-09-30 ENCOUNTER — Ambulatory Visit (INDEPENDENT_AMBULATORY_CARE_PROVIDER_SITE_OTHER): Payer: BC Managed Care – PPO | Admitting: Sports Medicine

## 2020-09-30 VITALS — BP 106/72 | Ht 66.0 in | Wt 208.0 lb

## 2020-09-30 DIAGNOSIS — M79661 Pain in right lower leg: Secondary | ICD-10-CM | POA: Diagnosis not present

## 2020-09-30 NOTE — Progress Notes (Addendum)
Office Visit Note   Patient: Nancy Jenkins           Date of Birth: 1986/08/07           MRN: [id number] Visit Date: 09/30/2020 Requested by: Jac Canavan, PA-C [address] [place],  [place] [address] PCP: Jac Canavan, PA-C  Subjective: CC: Right Calf Pain  HPI: 35 year old female presenting to clinic today with concerns of right calf pain for approximately 1 month.  Patient was previously seen in clinic, where the diagnosis of medial tibial stress syndrome was made and she says that she has significantly cut back on her running since this time.  She has been following the return to running protocol, but says that as she reached into the third mile, she started to experience a sharp cramping sensation in the back of her right calf.  Initially, this pain was just with running, but now it has bothered her with walking and progressed to pestering her throughout the day.  She denies any significant swelling in the leg, no bruising, no trauma.  She says that the leg is tender to palpation if her children or her dog jump on it.  She has been trying to wear her previously prescribed calf compression sleeve, with some improvement.  She says that, overall, she is very discouraged as she greatly wanted to participate in a 10K race this March and she is scared that this will stop her from being able to continue her running hobby.  No weakness or numbness in the foot.              ROS:   All other systems were reviewed and are negative.  Objective: Vital Signs: BP 106/72   Ht 5\' 6"  (1.676 m)   Wt 208 lb (94.3 kg)   BMI 33.57 kg/m   Physical Exam:  General:  Alert and oriented, in no acute distress. Pulm:  Breathing unlabored. Psy:  Normal mood, congruent affect. Skin: Right calf with no bruising, rashes, or erythema.  Overlying skin intact. Right Calf Exam:  General: Normal gait Standing exam: Mild genus valgus deformity bilaterally. Neutral arches.   Palpation: Tenderness to  palpation within the belly of the calf extending up to the lateral gastroc insertion.  No obvious masses appreciated.  No tenderness on the medial gastroc head.  No tenderness along the Achilles tendon.  No tenderness all over anterior tibia, or anterior tibialis musculature.  Denies calf pain with resisted dorsi or plantarflexion of the ankle.  No significant pain with heel lifts. Endorses pain with soleus stretching. No significant pain with gastroc stretch.   Strength: Hip flexion (L1), Hip Aduction (L2), Knee Extension (L3) are 5/5 Bilaterally Foot Inversion (L4), Dorsiflexion (L5), and Eversion (S1) 5/5 Bilaterally  Sensation: Intact to light touch medial and lateral aspects of lower extremities, and lateral, dorsal, and medial aspects of foot.    Imaging: Recent x-rays of the right tib-fib were unremarkable.  Assessment & Plan: 35 year old female presenting to clinic today with concerns of right posterior calf pain for approximately 1 month.  Patient says that she has been diligently adhering to the return to running protocol due to previous stress fracture, but started to notice this calf pain despite graduated return to activity.  Examination as above, concerning for irritation within the soleus musculature. -We will start conservative treatment with physical therapy.  Patient lives in [place], thus we will place referral to physical therapist office which is closer to this location. -Follow-up physical  therapist recommendation as far as return to running guidelines, as recommended physical therapist has advanced experience with running injuries. -Return to clinic in 4 to 6 weeks for reevaluation. -Patient expresses understanding with plan, she has no further questions or concerns today.   Patient seen and evaluated with the sports medicine fellow.  I agree with the above plan of care.  Patient appears to have a calf strain so we will treat as above.  I think she would benefit from  formal physical therapy and we also talked about custom orthotics to help with the impact of running (evaluation of her running gait shows her to be a midfoot striker landing with a heavy foot strike).  Follow-up in 4 to 6 weeks for reevaluation.

## 2020-10-01 ENCOUNTER — Telehealth: Payer: Self-pay | Admitting: Medical

## 2020-10-01 NOTE — Telephone Encounter (Signed)
I received a notice from your pharmacy that the Nancy Jenkins is not covered by insurance, and there was a note that weight loss medicine in general was not covered by insurance.  If we want to try a GLP-1 like Saxenda, I wonder if your insurance would cover Ozempic or Trulicity.  We may have to do labs first on your physical to show whether or not your blood sugar is elevated or not.  Or you may want to call insurance to inquire about options:  Qsymia Saxenda Ozempic Trulicity

## 2020-10-01 NOTE — Telephone Encounter (Signed)
Patient advised.

## 2020-10-11 ENCOUNTER — Other Ambulatory Visit: Payer: Self-pay | Admitting: Medical

## 2020-10-11 MED ORDER — TRULICITY 1.5 MG/0.5ML ~~LOC~~ SOAJ: 1.5000 mg | SUBCUTANEOUS | 1 refills | Status: DC

## 2020-10-13 ENCOUNTER — Other Ambulatory Visit: Payer: Self-pay

## 2020-10-13 MED ORDER — TRULICITY 1.5 MG/0.5ML ~~LOC~~ SOAJ: 1.5000 mg | SUBCUTANEOUS | 1 refills | Status: DC

## 2020-10-14 DIAGNOSIS — S86111D Strain of other muscle(s) and tendon(s) of posterior muscle group at lower leg level, right leg, subsequent encounter: Secondary | ICD-10-CM | POA: Diagnosis not present

## 2020-10-22 DIAGNOSIS — S86111D Strain of other muscle(s) and tendon(s) of posterior muscle group at lower leg level, right leg, subsequent encounter: Secondary | ICD-10-CM | POA: Diagnosis not present

## 2020-10-27 DIAGNOSIS — S86111D Strain of other muscle(s) and tendon(s) of posterior muscle group at lower leg level, right leg, subsequent encounter: Secondary | ICD-10-CM | POA: Diagnosis not present

## 2020-11-02 DIAGNOSIS — S86111D Strain of other muscle(s) and tendon(s) of posterior muscle group at lower leg level, right leg, subsequent encounter: Secondary | ICD-10-CM | POA: Diagnosis not present

## 2020-11-11 ENCOUNTER — Encounter: Payer: Self-pay | Admitting: Medical

## 2020-11-11 ENCOUNTER — Ambulatory Visit (INDEPENDENT_AMBULATORY_CARE_PROVIDER_SITE_OTHER): Payer: BC Managed Care – PPO | Admitting: Medical

## 2020-11-11 ENCOUNTER — Other Ambulatory Visit: Payer: Self-pay

## 2020-11-11 VITALS — BP 100/60 | HR 79 | Ht 66.0 in | Wt 204.2 lb

## 2020-11-11 DIAGNOSIS — Z79899 Other long term (current) drug therapy: Secondary | ICD-10-CM

## 2020-11-11 DIAGNOSIS — K219 Gastro-esophageal reflux disease without esophagitis: Secondary | ICD-10-CM

## 2020-11-11 DIAGNOSIS — Z6832 Body mass index (BMI) 32.0-32.9, adult: Secondary | ICD-10-CM | POA: Insufficient documentation

## 2020-11-11 DIAGNOSIS — Z975 Presence of (intrauterine) contraceptive device: Secondary | ICD-10-CM

## 2020-11-11 DIAGNOSIS — S86111D Strain of other muscle(s) and tendon(s) of posterior muscle group at lower leg level, right leg, subsequent encounter: Secondary | ICD-10-CM | POA: Diagnosis not present

## 2020-11-11 DIAGNOSIS — Z0000 Encounter for general adult medical examination without abnormal findings: Secondary | ICD-10-CM | POA: Diagnosis not present

## 2020-11-11 DIAGNOSIS — Z8639 Personal history of other endocrine, nutritional and metabolic disease: Secondary | ICD-10-CM | POA: Diagnosis not present

## 2020-11-11 DIAGNOSIS — L858 Other specified epidermal thickening: Secondary | ICD-10-CM

## 2020-11-11 DIAGNOSIS — F514 Sleep terrors [night terrors]: Secondary | ICD-10-CM | POA: Diagnosis not present

## 2020-11-11 DIAGNOSIS — E559 Vitamin D deficiency, unspecified: Secondary | ICD-10-CM | POA: Insufficient documentation

## 2020-11-11 DIAGNOSIS — Z13220 Encounter for screening for lipoid disorders: Secondary | ICD-10-CM

## 2020-11-11 NOTE — Progress Notes (Signed)
Subjective:   HPI  Nancy Jenkins is a 35 y.o. female who presents for Chief Complaint  Patient presents with  . Annual Exam    Physical with fasting labs     Patient Care Team: Aubrielle Stroud, Kermit Balo, PA-C as PCP - General (Family Medicine) Sees dentist Sees eye doctor Dr. Reino Bellis, sports medicine Dr. Luna Kitchens, gynecology   Concerns: Seeing physical therapy for ongoing right calf pain, had therapy yesterday including dry needling  She also donated blood yesterday so not sure this will affect her blood count  She is continue to work on efforts for weight loss.  She is currently using Trulicity.  At this point she has done 2 weeks of Saxenda, then switched to Trulicity due to insurance purposes.  So far she is down 10 pounds her home scale.  She is also using [employer] weight loss nutrition app/service for counseling and help with her efforts  She reports no particular side effects with Trulicity.  She sometimes get a little nausea the hours after injection but that goes away  She has a history of abnormal thyroid labs years ago was on medicine for period time may be around 2012.  Otherwise doing fine    Past Medical History:  Diagnosis Date  . GERD (gastroesophageal reflux disease)   . Night terrors   . Tension headache   . Thyroid activity decreased    in past  . Wears contact lenses     Family History  Problem Relation Age of Onset  . Diabetes Mother   . Depression Mother   . Hypertension Father   . Deep vein thrombosis Father   . Ulcerative colitis Father   . Heart disease Brother        congenital  . Hypertension Maternal Grandmother   . Cancer Maternal Grandfather        larynx  . Hypertension Paternal Grandmother   . Stroke Paternal Grandmother   . Cancer Paternal Grandfather        liver     Current Outpatient Medications:  .  cholecalciferol (VITAMIN D3) 25 MCG (1000 UNIT) tablet, Take 1 tablet (1,000 Units total) by mouth daily., Disp: 90  tablet, Rfl: 3 .  clonazePAM (KLONOPIN) 0.5 MG tablet, Take 1 tablet (0.5 mg total) by mouth daily., Disp: 90 tablet, Rfl: 0 .  Dulaglutide (TRULICITY) 1.5 MG/0.5ML SOPN, Inject 1.5 mg into the skin once a week., Disp: 1.5 mL, Rfl: 1 .  etonogestrel (NEXPLANON) 68 MG IMPL implant, 1 each by Subdermal route once., Disp: , Rfl:  .  Insulin Pen Needle (BD PEN NEEDLE NANO U/F) 32G X 4 MM MISC, 1 each by Does not apply route at bedtime., Disp: 30 each, Rfl: 5 .  meloxicam (MOBIC) 15 MG tablet, Take one pill a day with food for 7 days and then prn thereafter, Disp: 40 tablet, Rfl: 2 .  omeprazole (PRILOSEC) 20 MG capsule, TAKE 1 CAPSULE BY MOUTH  DAILY, Disp: 90 capsule, Rfl: 3 .  tretinoin (RETIN-A) 0.025 % cream, Apply topically at bedtime. (Patient not taking: No sig reported), Disp: 20 g, Rfl: 0  No Known Allergies  Depression screen [facility] 2/9 11/11/2020 11/13/2019 06/26/2018 03/25/2018  Decreased Interest 0 0 0 0  Down, Depressed, Hopeless 0 0 0 0  PHQ - 2 Score 0 0 0 0     Reviewed their medical, surgical, family, social, medication, and allergy history and updated chart as appropriate.   Review of Systems Constitutional: -fever, -chills, -sweats, -  unexpected weight change, -decreased appetite, -fatigue Allergy: -sneezing, -itching, -congestion Dermatology: -changing moles, --rash, -lumps ENT: -runny nose, -ear pain, -sore throat, -hoarseness, -sinus pain, -teeth pain, - ringing in ears, -hearing loss, -nosebleeds Cardiology: -chest pain, -palpitations, -swelling, -difficulty breathing when lying flat, -waking up short of breath Respiratory: -cough, -shortness of breath, -difficulty breathing with exercise or exertion, -wheezing, -coughing up blood Gastroenterology: -abdominal pain, -nausea, -vomiting, -diarrhea, -constipation, -blood in stool, -changes in bowel movement, -difficulty swallowing or eating Hematology: -bleeding, -bruising  Musculoskeletal: -joint aches, +muscle aches, -joint  swelling, -back pain, -neck pain, -cramping, -changes in gait Ophthalmology: denies vision changes, eye redness, itching, discharge Urology: -burning with urination, -difficulty urinating, -blood in urine, -urinary frequency, -urgency, -incontinence Neurology: -headache, -weakness, -tingling, -numbness, -memory loss, -falls, -dizziness Psychology: -depressed mood, -agitation, -sleep problems Breast/gyn: -breast tendnerss, -discharge, -lumps, -vaginal discharge,- irregular periods, -heavy periods     Objective:  BP 100/60   Pulse 79   Ht 5\' 6"  (1.676 m)   Wt 204 lb 3.2 oz (92.6 kg)   SpO2 99%   BMI 32.96 kg/m   General appearance: alert, no distress, WD/WN, Caucasian female Skin: inferior to right lower eyelid is a 69mm raised fleshy white papular lesion, unremarkable HEENT: normocephalic, conjunctiva/corneas normal, sclerae anicteric, PERRLA, EOMi, nares patent, no discharge or erythema, pharynx normal Oral cavity: MMM, tongue normal, teeth normal Neck: supple, no lymphadenopathy, no thyromegaly, no masses, normal ROM, no bruits Chest: non tender, normal shape and expansion Heart: RRR, normal S1, S2, no murmurs Lungs: CTA bilaterally, no wheezes, rhonchi, or rales Abdomen: +bs, soft, non tender, non distended, no masses, no hepatomegaly, no splenomegaly, no bruits Back: non tender, normal ROM, no scoliosis Musculoskeletal: upper extremities non tender, no obvious deformity, normal ROM throughout, lower extremities non tender, no obvious deformity, normal ROM throughout Extremities: no edema, no cyanosis, no clubbing Pulses: 2+ symmetric, upper and lower extremities, normal cap refill Neurological: alert, oriented x 3, CN2-12 intact, strength normal upper extremities and lower extremities, sensation normal throughout, DTRs 2+ throughout, no cerebellar signs, gait normal Psychiatric: normal affect, behavior normal, pleasant  Breast/gyn/rectal - deferred to gynecology     Assessment  and Plan :   Encounter Diagnoses  Name Primary?  . Encounter for health maintenance examination in adult Yes  . Screening for lipid disorders   . Night terrors   . Nexplanon in place   . Keratosis pilaris   . High risk medication use   . Gastroesophageal reflux disease without esophagitis   . History of thyroid disorder   . BMI 32.0-32.9,adult   . Vitamin D deficiency     Today you had a preventative care visit or wellness visit.    Topics today may have included healthy lifestyle, diet, exercise, preventative care, vaccinations, sick and well care, proper use of emergency dept and after hours care, as well as other concerns.     Recommendations: Continue to return yearly for your annual wellness and preventative care visits.  This gives 02-07-1995 a chance to discuss healthy lifestyle, exercise, vaccinations, review your chart record, and perform screenings where appropriate.  I recommend you see your eye doctor yearly for routine vision care.  I recommend you see your dentist yearly for routine dental care including hygiene visits twice yearly.   Vaccination recommendations were reviewed  You are up to date on tetanus, flu and covid vaccines   Screening for cancer: Breast cancer screening: You should perform a self breast exam monthly.   We reviewed recommendations  for regular mammograms and breast cancer screening.  Colon cancer screening:  Age 45yo  Cervical cancer screening: We reviewed recommendations for pap smear screening.  Skin cancer screening: Check your skin regularly for new changes, growing lesions, or other lesions of concern Come in for evaluation if you have skin lesions of concern.  Lung cancer screening: If you have a greater than 30 pack year history of tobacco use, then you qualify for lung cancer screening with a chest CT scan  We currently don't have screenings for other cancers besides breast, cervical, colon, and lung cancers.  If you have a strong  family history of cancer or have other cancer screening concerns, please let me know.    Bone health: Get at least 150 minutes of aerobic exercise weekly Get weight bearing exercise at least once weekly   Heart health: Get at least 150 minutes of aerobic exercise weekly Limit alcohol It is important to maintain a healthy blood pressure and healthy cholesterol numbers   Separate significant issues discussed: Night terrors-chronic, but has done well on current therapy ongoing.  Uses clonazepam most nights.  GERD-doing fine on omeprazole, trigger avoidance  History of thyroid abnormality on labs-recheck labs although they have been stable the last few years  BMI > 32 - seeing improvements on trulicity in conjunction with regular exerise and healthy eating, utilizing [employer] cousneling app  Keratosis pilaris - she will begin topical cream sparingly for lesion as discussed  Vit D deficiency - update labs today  Shanessa was seen today for annual exam.  Diagnoses and all orders for this visit:  Encounter for health maintenance examination in adult -     Comprehensive metabolic panel -     CBC -     VITAMIN D 25 Hydroxy (Vit-D Deficiency, Fractures) -     Lipid panel -     Cancel: POCT Urinalysis DIP (Proadvantage Device) -     TSH -     Hemoglobin A1c  Screening for lipid disorders -     Lipid panel  Night terrors  Nexplanon in place  Keratosis pilaris  High risk medication use  Gastroesophageal reflux disease without esophagitis  History of thyroid disorder -     TSH  BMI 32.0-32.9,adult -     Hemoglobin A1c  Vitamin D deficiency   Follow-up pending labs, yearly for physical

## 2020-11-12 LAB — TSH: TSH: 2.6 u[IU]/mL (ref 0.450–4.500)

## 2020-11-12 LAB — CBC
Hematocrit: 37 % (ref 34.0–46.6)
Hemoglobin: 12 g/dL (ref 11.1–15.9)
MCH: 30.2 pg (ref 26.6–33.0)
MCHC: 32.4 g/dL (ref 31.5–35.7)
MCV: 93 fL (ref 79–97)
Platelets: 301 10*3/uL (ref 150–450)
RBC: 3.97 x10E6/uL (ref 3.77–5.28)
RDW: 12.6 % (ref 11.7–15.4)
WBC: 6.7 10*3/uL (ref 3.4–10.8)

## 2020-11-12 LAB — COMPREHENSIVE METABOLIC PANEL
ALT: 12 IU/L (ref 0–32)
AST: 14 IU/L (ref 0–40)
Albumin/Globulin Ratio: 1.7 (ref 1.2–2.2)
Albumin: 4.4 g/dL (ref 3.8–4.8)
Alkaline Phosphatase: 58 IU/L (ref 44–121)
BUN/Creatinine Ratio: 11 (ref 9–23)
BUN: 9 mg/dL (ref 6–20)
Bilirubin Total: 0.3 mg/dL (ref 0.0–1.2)
CO2: 22 mmol/L (ref 20–29)
Calcium: 9.2 mg/dL (ref 8.7–10.2)
Chloride: 102 mmol/L (ref 96–106)
Creatinine, Ser: 0.84 mg/dL (ref 0.57–1.00)
GFR calc Af Amer: 105 mL/min/{1.73_m2} (ref >59)
GFR calc non Af Amer: 91 mL/min/{1.73_m2} (ref >59)
Globulin, Total: 2.6 g/dL (ref 1.5–4.5)
Glucose: 82 mg/dL (ref 65–99)
Potassium: 4.3 mmol/L (ref 3.5–5.2)
Sodium: 140 mmol/L (ref 134–144)
Total Protein: 7 g/dL (ref 6.0–8.5)

## 2020-11-12 LAB — LIPID PANEL
Chol/HDL Ratio: 3.9 ratio (ref 0.0–4.4)
Cholesterol, Total: 149 mg/dL (ref 100–199)
HDL: 38 mg/dL — ABNORMAL LOW (ref >39)
LDL Chol Calc (NIH): 95 mg/dL (ref 0–99)
Triglycerides: 81 mg/dL (ref 0–149)
VLDL Cholesterol Cal: 16 mg/dL (ref 5–40)

## 2020-11-12 LAB — HEMOGLOBIN A1C
Est. average glucose Bld gHb Est-mCnc: 100 mg/dL
Hgb A1c MFr Bld: 5.1 % (ref 4.8–5.6)

## 2020-11-12 LAB — VITAMIN D 25 HYDROXY (VIT D DEFICIENCY, FRACTURES): Vit D, 25-Hydroxy: 57.4 ng/mL (ref 30.0–100.0)

## 2020-11-12 MED ORDER — VITAMIN D 25 MCG (1000 UNIT) PO TABS: 1000.0000 [IU] | ORAL_TABLET | Freq: Every day | ORAL | 3 refills | Status: DC

## 2020-11-12 NOTE — Addendum Note (Signed)
Addended by: Jac Canavan on: 11/12/2020 04:34 PM   Modules accepted: Orders

## 2020-11-16 DIAGNOSIS — S86111D Strain of other muscle(s) and tendon(s) of posterior muscle group at lower leg level, right leg, subsequent encounter: Secondary | ICD-10-CM | POA: Diagnosis not present

## 2020-12-03 DIAGNOSIS — S86111D Strain of other muscle(s) and tendon(s) of posterior muscle group at lower leg level, right leg, subsequent encounter: Secondary | ICD-10-CM | POA: Diagnosis not present

## 2020-12-14 DIAGNOSIS — M9902 Segmental and somatic dysfunction of thoracic region: Secondary | ICD-10-CM | POA: Diagnosis not present

## 2020-12-14 DIAGNOSIS — M5413 Radiculopathy, cervicothoracic region: Secondary | ICD-10-CM | POA: Diagnosis not present

## 2020-12-14 DIAGNOSIS — M546 Pain in thoracic spine: Secondary | ICD-10-CM | POA: Diagnosis not present

## 2020-12-14 DIAGNOSIS — M9901 Segmental and somatic dysfunction of cervical region: Secondary | ICD-10-CM | POA: Diagnosis not present

## 2020-12-16 DIAGNOSIS — M5413 Radiculopathy, cervicothoracic region: Secondary | ICD-10-CM | POA: Diagnosis not present

## 2020-12-16 DIAGNOSIS — M546 Pain in thoracic spine: Secondary | ICD-10-CM | POA: Diagnosis not present

## 2020-12-16 DIAGNOSIS — M9901 Segmental and somatic dysfunction of cervical region: Secondary | ICD-10-CM | POA: Diagnosis not present

## 2020-12-16 DIAGNOSIS — M9902 Segmental and somatic dysfunction of thoracic region: Secondary | ICD-10-CM | POA: Diagnosis not present

## 2020-12-31 ENCOUNTER — Other Ambulatory Visit: Payer: Self-pay | Admitting: Medical

## 2021-01-03 NOTE — Telephone Encounter (Signed)
Is this okay to refill? 

## 2021-03-24 ENCOUNTER — Other Ambulatory Visit: Payer: Self-pay | Admitting: Medical

## 2021-03-24 ENCOUNTER — Other Ambulatory Visit: Payer: Self-pay | Admitting: Family Medicine

## 2021-03-24 NOTE — Telephone Encounter (Signed)
[facility] is requesting to fill pt klonopin. Please advise Kh

## 2021-03-25 ENCOUNTER — Other Ambulatory Visit: Payer: Self-pay | Admitting: Family Medicine

## 2021-03-25 MED ORDER — CLONAZEPAM 0.5 MG PO TABS: 0.5000 mg | ORAL_TABLET | Freq: Every day | ORAL | 0 refills | Status: DC

## 2021-04-03 ENCOUNTER — Other Ambulatory Visit: Payer: Self-pay | Admitting: Medical

## 2021-04-03 DIAGNOSIS — K219 Gastro-esophageal reflux disease without esophagitis: Secondary | ICD-10-CM

## 2021-04-28 ENCOUNTER — Telehealth: Payer: BC Managed Care – PPO | Admitting: Medical

## 2021-05-02 ENCOUNTER — Encounter: Payer: Self-pay | Admitting: Medical

## 2021-05-02 ENCOUNTER — Ambulatory Visit (INDEPENDENT_AMBULATORY_CARE_PROVIDER_SITE_OTHER): Payer: BC Managed Care – PPO | Admitting: Medical

## 2021-05-02 ENCOUNTER — Other Ambulatory Visit: Payer: Self-pay

## 2021-05-02 VITALS — BP 110/70 | HR 109 | Temp 97.8°F | Wt 191.6 lb

## 2021-05-02 DIAGNOSIS — R509 Fever, unspecified: Secondary | ICD-10-CM

## 2021-05-02 DIAGNOSIS — I889 Nonspecific lymphadenitis, unspecified: Secondary | ICD-10-CM

## 2021-05-02 DIAGNOSIS — R5383 Other fatigue: Secondary | ICD-10-CM | POA: Diagnosis not present

## 2021-05-02 NOTE — Progress Notes (Signed)
Subjective:  Nancy Jenkins is a 35 y.o. female who presents for Chief Complaint  Patient presents with   swollen lymphnodes    Fatigue, low grade fever. [place] lymphnodes under arm and neck. All since august 3rd. Took multiple covid tests. All negative     Here with her 2 young children.  Here for evaluation and labs.  Has had ongoing fever.  She notes fatigue and feeling tired.  Gets neck tension a lot, so just attributed to tired, working extra shifts.  Has had some headache.   First had fever on 04/21/21, fever 102, then fever next few days.  Had negative covid test during this time.   Continues to have fever, neck ache.  Last few days fever low grade.  Ended up going on vacation last week to the beach.  took it easy, but continued to have fever and fatigue all week on vacation.     Had massage 04/25/21. She was achy with the massage and noticed tender nodes in armpits as well.     Did tele health visit due to symptoms.  Was put on Augmentin for symptoms above, but also ear ache from where she was squirted in the ear with water.   Last night still low grade fever, 99.3 yesterday.  Has had about 10 days of fever.    Currently no URI symptoms, no cough, no sore throat, no ear pain, no sneezing or congestion.   No urinary symptoms, no urinary frequency, urgency, blood in urine.  No abdominal or back pain.   No joint aches or swelling.   Muscles are achy in general though.   No recent rash or tick bites.      No other symptoms.  She is a PA working in urgent care.    She already ran urinalysis that was normal, mono that was normal, negative covid tests.  Negative pregnancy and she has nexplanon in place.   Pap up to date last year normal.   No prior mammogram, but no recent breast exam  No diarrhea, BMs normal.   No animal bite or scratch.  Has 2 dogs.    Nonsmoker.  No specific sick exposure.   Family is fine without illness.    No photophobia. Neck is achy with nodes but not with ROM.  No  other aggravating or relieving factors.    No other c/o.  Past Medical History:  Diagnosis Date   GERD (gastroesophageal reflux disease)    Night terrors    Tension headache    Thyroid activity decreased    in past   Wears contact lenses    Current Outpatient Medications on File Prior to Visit  Medication Sig Dispense Refill   cholecalciferol (VITAMIN D3) 25 MCG (1000 UNIT) tablet Take 1 tablet (1,000 Units total) by mouth daily. 90 tablet 3   clonazePAM (KLONOPIN) 0.5 MG tablet Take 1 tablet (0.5 mg total) by mouth daily. 90 tablet 0   etonogestrel (NEXPLANON) 68 MG IMPL implant 1 each by Subdermal route once.     Insulin Pen Needle (BD PEN NEEDLE NANO U/F) 32G X 4 MM MISC 1 each by Does not apply route at bedtime. 30 each 5   omeprazole (PRILOSEC) 20 MG capsule TAKE 1 CAPSULE BY MOUTH  DAILY 90 capsule 0   TRULICITY 1.5 MG/0.5ML SOPN INJECT THE CONTENTS OF ONE  PEN SUBCUTANEOUSLY WEEKLY  AS DIRECTED 6 mL 1   No current facility-administered medications on file prior to visit.  The following portions of the patient's history were reviewed and updated as appropriate: allergies, current medications, past family history, past medical history, past social history, past surgical history and problem list.  ROS Otherwise as in subjective above    Objective: BP 110/70   Pulse (!) 109   Temp 97.8 F (36.6 C)   Wt 191 lb 9.6 oz (86.9 kg)   SpO2 99%   BMI 30.93 kg/m   Wt Readings from Last 3 Encounters:  05/02/21 191 lb 9.6 oz (86.9 kg)  11/11/20 204 lb 3.2 oz (92.6 kg)  09/30/20 208 lb (94.3 kg)    General appearance: alert, no distress, well developed, well nourished, white female HEENT: normocephalic, sclerae anicteric, conjunctiva pink and moist, TMs pearly, nares patent, no discharge or erythema, pharynx normal Oral cavity: MMM, no lesions Neck: supple, shoddy anterior tender nodes, otherwise no thyromegaly, no masses Heart: RRR, normal S1, S2, no murmurs Lungs:  CTA bilaterally, no wheezes, rhonchi, or rales Abdomen: +bs, soft, non tender, non distended, no masses, no hepatomegaly, no splenomegaly Pulses: 2+ radial pulses, 2+ pedal pulses, normal cap refill Ext: no edema Skin: unremarkable, no rash Breast exam declined/deferred today Axilla with some barely palpable tender nodes bilat   Assessment: Encounter Diagnoses  Name Primary?   Fever, unspecified fever cause Yes   Lymphadenitis    Fatigue, unspecified type      Plan: Discussed her symptoms, possible causes.   Labs as below today to further evaluate for fever and malaise.    Hydrate well, use relative rest in the meantime.    Advised self breast exam  [name] was seen today for swollen lymphnodes.  Diagnoses and all orders for this visit:  Fever, unspecified fever cause -     Comprehensive metabolic panel -     CBC with Differential/Platelet -     Urinalysis -     EPSTEIN-BARR VIRUS (EBV) Antibody Profile -     CMV DNA, quantitative, PCR -     Lyme Disease Serology w/Reflex  Lymphadenitis -     Comprehensive metabolic panel -     CBC with Differential/Platelet -     Urinalysis -     EPSTEIN-BARR VIRUS (EBV) Antibody Profile -     CMV DNA, quantitative, PCR -     Lyme Disease Serology w/Reflex  Fatigue, unspecified type -     Comprehensive metabolic panel -     CBC with Differential/Platelet -     Urinalysis -     EPSTEIN-BARR VIRUS (EBV) Antibody Profile -     CMV DNA, quantitative, PCR -     Lyme Disease Serology w/Reflex   Follow up: pending labs

## 2021-05-04 LAB — URINALYSIS
Bilirubin, UA: NEGATIVE
Glucose, UA: NEGATIVE
Ketones, UA: NEGATIVE
Leukocytes,UA: NEGATIVE
Nitrite, UA: NEGATIVE
Protein,UA: NEGATIVE
RBC, UA: NEGATIVE
Specific Gravity, UA: 1.009 (ref 1.005–1.030)
Urobilinogen, Ur: 0.2 mg/dL (ref 0.2–1.0)
pH, UA: 7 (ref 5.0–7.5)

## 2021-05-04 LAB — CBC WITH DIFFERENTIAL/PLATELET
Basophils Absolute: 0 10*3/uL (ref 0.0–0.2)
Basos: 0 %
EOS (ABSOLUTE): 0 10*3/uL (ref 0.0–0.4)
Eos: 1 %
Hematocrit: 42.1 % (ref 34.0–46.6)
Hemoglobin: 14 g/dL (ref 11.1–15.9)
Immature Grans (Abs): 0 10*3/uL (ref 0.0–0.1)
Immature Granulocytes: 0 %
Lymphocytes Absolute: 1.9 10*3/uL (ref 0.7–3.1)
Lymphs: 25 %
MCH: 29.9 pg (ref 26.6–33.0)
MCHC: 33.3 g/dL (ref 31.5–35.7)
MCV: 90 fL (ref 79–97)
Monocytes Absolute: 0.4 10*3/uL (ref 0.1–0.9)
Monocytes: 6 %
Neutrophils Absolute: 5 10*3/uL (ref 1.4–7.0)
Neutrophils: 68 %
Platelets: 397 10*3/uL (ref 150–450)
RBC: 4.69 x10E6/uL (ref 3.77–5.28)
RDW: 13 % (ref 11.7–15.4)
WBC: 7.4 10*3/uL (ref 3.4–10.8)

## 2021-05-04 LAB — COMPREHENSIVE METABOLIC PANEL
ALT: 17 IU/L (ref 0–32)
AST: 16 IU/L (ref 0–40)
Albumin/Globulin Ratio: 1.4 (ref 1.2–2.2)
Albumin: 4.6 g/dL (ref 3.8–4.8)
Alkaline Phosphatase: 67 IU/L (ref 44–121)
BUN/Creatinine Ratio: 12 (ref 9–23)
BUN: 10 mg/dL (ref 6–20)
Bilirubin Total: <0.2 mg/dL (ref 0.0–1.2)
CO2: 24 mmol/L (ref 20–29)
Calcium: 10 mg/dL (ref 8.7–10.2)
Chloride: 104 mmol/L (ref 96–106)
Creatinine, Ser: 0.85 mg/dL (ref 0.57–1.00)
Globulin, Total: 3.3 g/dL (ref 1.5–4.5)
Glucose: 107 mg/dL — ABNORMAL HIGH (ref 65–99)
Potassium: 4.9 mmol/L (ref 3.5–5.2)
Sodium: 142 mmol/L (ref 134–144)
Total Protein: 7.9 g/dL (ref 6.0–8.5)
eGFR: 92 mL/min/{1.73_m2} (ref >59)

## 2021-05-04 LAB — EPSTEIN-BARR VIRUS (EBV) ANTIBODY PROFILE
EBV NA IgG: >600 U/mL — ABNORMAL HIGH (ref 0.0–17.9)
EBV VCA IgG: >600 U/mL — ABNORMAL HIGH (ref 0.0–17.9)
EBV VCA IgM: <36 U/mL (ref 0.0–35.9)

## 2021-05-04 LAB — CMV DNA, QUANTITATIVE, PCR: CMV DNA Quant: NEGATIVE IU/mL

## 2021-05-04 LAB — LYME DISEASE SEROLOGY W/REFLEX: Lyme Total Antibody EIA: NEGATIVE

## 2021-06-07 ENCOUNTER — Encounter: Payer: Self-pay | Admitting: Radiology

## 2021-06-16 ENCOUNTER — Other Ambulatory Visit: Payer: Self-pay | Admitting: Medical

## 2021-06-16 DIAGNOSIS — K219 Gastro-esophageal reflux disease without esophagitis: Secondary | ICD-10-CM

## 2021-06-27 ENCOUNTER — Encounter: Payer: Self-pay | Admitting: Internal Medicine

## 2021-09-09 ENCOUNTER — Other Ambulatory Visit: Payer: Self-pay | Admitting: Medical

## 2021-09-13 NOTE — Telephone Encounter (Signed)
Called patient is still taking medication- scheduled upcoming CPE in January

## 2021-09-27 ENCOUNTER — Other Ambulatory Visit: Payer: Self-pay | Admitting: Medical

## 2021-09-27 MED ORDER — CLONAZEPAM 0.5 MG PO TABS: 0.5000 mg | ORAL_TABLET | Freq: Every day | ORAL | 0 refills | Status: DC

## 2021-10-03 ENCOUNTER — Other Ambulatory Visit: Payer: Self-pay

## 2021-10-03 ENCOUNTER — Encounter: Payer: Self-pay | Admitting: Medical

## 2021-10-03 ENCOUNTER — Ambulatory Visit (INDEPENDENT_AMBULATORY_CARE_PROVIDER_SITE_OTHER): Payer: BC Managed Care – PPO | Admitting: Medical

## 2021-10-03 VITALS — BP 120/70 | HR 72 | Ht 67.5 in | Wt 187.0 lb

## 2021-10-03 DIAGNOSIS — Z79899 Other long term (current) drug therapy: Secondary | ICD-10-CM

## 2021-10-03 DIAGNOSIS — Z0000 Encounter for general adult medical examination without abnormal findings: Secondary | ICD-10-CM | POA: Diagnosis not present

## 2021-10-03 DIAGNOSIS — R7989 Other specified abnormal findings of blood chemistry: Secondary | ICD-10-CM

## 2021-10-03 DIAGNOSIS — E663 Overweight: Secondary | ICD-10-CM

## 2021-10-03 DIAGNOSIS — F514 Sleep terrors [night terrors]: Secondary | ICD-10-CM

## 2021-10-03 DIAGNOSIS — Z975 Presence of (intrauterine) contraceptive device: Secondary | ICD-10-CM

## 2021-10-03 DIAGNOSIS — Z13220 Encounter for screening for lipoid disorders: Secondary | ICD-10-CM | POA: Diagnosis not present

## 2021-10-03 DIAGNOSIS — K219 Gastro-esophageal reflux disease without esophagitis: Secondary | ICD-10-CM

## 2021-10-03 DIAGNOSIS — E282 Polycystic ovarian syndrome: Secondary | ICD-10-CM

## 2021-10-03 DIAGNOSIS — E559 Vitamin D deficiency, unspecified: Secondary | ICD-10-CM

## 2021-10-03 NOTE — Progress Notes (Signed)
°Subjective:  ° °HPI ° Nancy Jenkins is a 35 y.o. female who presents for °Chief Complaint  °Patient presents with  ° fasting cpe  °  Fasting cpe. Sees obgyn  ° ° °Patient Care Team: °Tysinger, David S, PA-C as PCP - General (Family Medicine) °Sees dentist °Sees eye doctor °Dr. Timothy Draper, sports medicine °Dr. Kathryn Kooistra, gynecology ° ° °Concerns: °She has had some off-and-on nausea and vomiting since October.  She did have some episodes of stomach virus where she was around her own children that were sick or patients that were sick.  However she has had over the times without provocation with either acid reflux nausea or vomiting.  She uses omeprazole pretty regularly.  She still takes Trulicity.  She sometimes gets purplish looking particles in her vomit ° °She has a history of abnormal thyroid labs years ago was on medicine for period time may be around 2012. ° °Otherwise doing fine ° ° ° °Past Medical History:  °Diagnosis Date  ° GERD (gastroesophageal reflux disease)   ° Night terrors   ° Tension headache   ° Thyroid activity decreased   ° in past  ° Wears contact lenses   ° ° °Family History  °Problem Relation Age of Onset  ° Diabetes Mother   ° Depression Mother   ° Hypertension Father   ° Deep vein thrombosis Father   ° Ulcerative colitis Father   ° Heart disease Brother   °     congenital  ° Hypertension Maternal Grandmother   ° Cancer Maternal Grandfather   °     larynx  ° Hypertension Paternal Grandmother   ° Stroke Paternal Grandmother   ° Cancer Paternal Grandfather   °     liver  ° ° ° °Current Outpatient Medications:  °  cholecalciferol (VITAMIN D3) 25 MCG (1000 UNIT) tablet, Take 1 tablet (1,000 Units total) by mouth daily., Disp: 90 tablet, Rfl: 3 °  clonazePAM (KLONOPIN) 0.5 MG tablet, Take 1 tablet (0.5 mg total) by mouth daily., Disp: 90 tablet, Rfl: 0 °  etonogestrel (NEXPLANON) 68 MG IMPL implant, 1 each by Subdermal route once., Disp: , Rfl:  °  omeprazole (PRILOSEC) 20 MG capsule,  TAKE 1 CAPSULE BY MOUTH  DAILY, Disp: 90 capsule, Rfl: 3 °  TRULICITY 1.5 MG/0.5ML SOPN, INJECT THE CONTENTS OF ONE  PEN SUBCUTANEOUSLY WEEKLY  AS DIRECTED, Disp: 6 mL, Rfl: 1 °  Insulin Pen Needle (BD PEN NEEDLE NANO U/F) 32G X 4 MM MISC, 1 each by Does not apply route at bedtime., Disp: 30 each, Rfl: 5 ° °No Known Allergies ° °Depression screen PHQ 2/9 10/03/2021 05/02/2021 11/11/2020 11/13/2019 06/26/2018  °Decreased Interest 0 0 0 0 0  °Down, Depressed, Hopeless 0 0 0 0 0  °PHQ - 2 Score 0 0 0 0 0  ° ° ° °Reviewed their medical, surgical, family, social, medication, and allergy history and updated chart as appropriate. ° ° °Review of Systems °Constitutional: -fever, -chills, -sweats, -unexpected weight change, -decreased appetite, -fatigue °Allergy: -sneezing, -itching, -congestion °Dermatology: -changing moles, --rash, -lumps °ENT: -runny nose, -ear pain, -sore throat, -hoarseness, -sinus pain, -teeth pain, - ringing in ears, -hearing loss, -nosebleeds °Cardiology: -chest pain, -palpitations, -swelling, -difficulty breathing when lying flat, -waking up short of breath °Respiratory: -cough, -shortness of breath, -difficulty breathing with exercise or exertion, -wheezing, -coughing up blood °Gastroenterology: -abdominal pain, +nausea, +vomiting, -diarrhea, -constipation, -blood in stool, -changes in bowel movement, -difficulty swallowing or eating °Hematology: -bleeding, -bruising  °Musculoskeletal: -joint   joint aches, -muscle aches, -joint swelling, -back pain, -neck pain, -cramping, -changes in gait Ophthalmology: denies vision changes, eye redness, itching, discharge Urology: -burning with urination, -difficulty urinating, -blood in urine, -urinary frequency, -urgency, -incontinence Neurology: -headache, -weakness, -tingling, -numbness, -memory loss, -falls, -dizziness Psychology: -depressed mood, -agitation, -sleep problems Breast/gyn: -breast tendnerss, -discharge, -lumps, -vaginal discharge,- irregular periods,  -heavy periods     Objective:  BP 120/70    Pulse 72    Ht 5' 7.5" (1.715 m)    Wt 187 lb (84.8 kg)    BMI 28.86 kg/m   General appearance: alert, no distress, WD/WN, Caucasian female Skin: unremarkable, tattoo scorpion mid back midline HEENT: normocephalic, conjunctiva/corneas normal, sclerae anicteric, PERRLA, EOMi Neck: supple, no lymphadenopathy, no thyromegaly, no masses, normal ROM, no bruits Chest: non tender, normal shape and expansion Heart: RRR, normal S1, S2, no murmurs Lungs: CTA bilaterally, no wheezes, rhonchi, or rales Abdomen: +bs, soft, non tender, non distended, no masses, no hepatomegaly, no splenomegaly, no bruits Back: non tender, normal ROM, no scoliosis Musculoskeletal: upper extremities non tender, no obvious deformity, normal ROM throughout, lower extremities non tender, no obvious deformity, normal ROM throughout Extremities: no edema, no cyanosis, no clubbing Pulses: 2+ symmetric, upper and lower extremities, normal cap refill Neurological: alert, oriented x 3, CN2-12 intact, strength normal upper extremities and lower extremities, sensation normal throughout, DTRs 2+ throughout, no cerebellar signs, gait normal Psychiatric: normal affect, behavior normal, pleasant  Breast/gyn/rectal - deferred to gynecology     Assessment and Plan :   Encounter Diagnoses  Name Primary?   Encounter for health maintenance examination in adult Yes   Vitamin D deficiency    Screening for lipid disorders    PCOS (polycystic ovarian syndrome)    Overweight (BMI 25.0-29.9)    Night terrors    Nexplanon in place    High risk medication use    Gastroesophageal reflux disease without esophagitis    Abnormal thyroid blood test     Today you had a preventative care visit or wellness visit.    Topics today may have included healthy lifestyle, diet, exercise, preventative care, vaccinations, sick and well care, proper use of emergency dept and after hours care, as well as other  concerns.     Recommendations: Continue to return yearly for your annual wellness and preventative care visits.  This gives [place] a chance to discuss healthy lifestyle, exercise, vaccinations, review your chart record, and perform screenings where appropriate.  I recommend you see your eye doctor yearly for routine vision care.  I recommend you see your dentist yearly for routine dental care including hygiene visits twice yearly.  See your gynecologist yearly for routine gynecological care.    Vaccination recommendations were reviewed Immunization History  Administered Date(s) Administered   Influenza Split 06/16/2021   Influenza-Unspecified 08/16/2011, 06/13/2017, 05/14/2018, 06/02/2020   MMR 01/17/2013   Moderna Sars-Covid-2 Vaccination 09/18/2019, 10/17/2019, 09/24/2020   PPD Test 11/18/2013   Td 12/13/2004   Tdap 11/01/2012     Screening for cancer: Breast cancer screening: You should perform a self breast exam monthly.   We reviewed recommendations for regular mammograms and breast cancer screening.  Colon cancer screening:  Age 32yo  Cervical cancer screening: We reviewed recommendations for pap smear screening.  2021 pap reviewed, normal, neg HPV  Skin cancer screening: Check your skin regularly for new changes, growing lesions, or other lesions of concern Come in for evaluation if you have skin lesions of concern.  Lung cancer screening: If  you have a greater than 30 pack year history of tobacco use, then you qualify for lung cancer screening with a chest CT scan  We currently don't have screenings for other cancers besides breast, cervical, colon, and lung cancers.  If you have a strong family history of cancer or have other cancer screening concerns, please let me know.    Bone health: Get at least 150 minutes of aerobic exercise weekly Get weight bearing exercise at least once weekly   Heart health: Get at least 150 minutes of aerobic exercise  weekly Limit alcohol It is important to maintain a healthy blood pressure and healthy cholesterol numbers   Separate significant issues discussed: Nausea and vomiting -I wonder about her GLP-1 medication possibly causing this.  She will cut back on this over the next month and see if this makes a difference.  I asked her to try may be using every other week or every 2 weeks and see if her symptoms overall improved.  Consider GI consult if symptoms or not resolving  Night terrors-chronic, but has done well on current therapy ongoing.  Uses clonazepam most nights.   Reduce stress when possible, discussed her recent emotional state and stress.  Things have been going well of late.  Consider using 1/2 doses of medication when possible  GERD-continue omeprazole, advised trigger avoidance, may need to consider higher dose or changing to Dexliant or Nexium  History of thyroid abnormality on labs-last 2 years of thyroid labs stable.   BMI > 28: she has lost weight this past year through stricter diet , exercise and use of GLP1 medicaiton has been helpful  Vit D deficiency - update labs today  Nancy Jenkins was seen today for fasting cpe.  Diagnoses and all orders for this visit:  Encounter for health maintenance examination in adult -     Lipid panel -     VITAMIN D 25 Hydroxy (Vit-D Deficiency, Fractures) -     Hemoglobin A1c -     Basic metabolic panel  Vitamin D deficiency -     VITAMIN D 25 Hydroxy (Vit-D Deficiency, Fractures)  Screening for lipid disorders -     Lipid panel  PCOS (polycystic ovarian syndrome)  Overweight (BMI 25.0-29.9)  Night terrors  Nexplanon in place  High risk medication use  Gastroesophageal reflux disease without esophagitis  Abnormal thyroid blood test   Follow-up pending labs, yearly for physical

## 2021-10-04 LAB — HEMOGLOBIN A1C
Est. average glucose Bld gHb Est-mCnc: 108 mg/dL
Hgb A1c MFr Bld: 5.4 % (ref 4.8–5.6)

## 2021-10-04 LAB — BASIC METABOLIC PANEL
BUN/Creatinine Ratio: 14 (ref 9–23)
BUN: 13 mg/dL (ref 6–20)
CO2: 23 mmol/L (ref 20–29)
Calcium: 9.6 mg/dL (ref 8.7–10.2)
Chloride: 102 mmol/L (ref 96–106)
Creatinine, Ser: 0.92 mg/dL (ref 0.57–1.00)
Glucose: 88 mg/dL (ref 70–99)
Potassium: 4.5 mmol/L (ref 3.5–5.2)
Sodium: 139 mmol/L (ref 134–144)
eGFR: 83 mL/min/{1.73_m2} (ref >59)

## 2021-10-04 LAB — LIPID PANEL
Chol/HDL Ratio: 3.5 ratio (ref 0.0–4.4)
Cholesterol, Total: 157 mg/dL (ref 100–199)
HDL: 45 mg/dL (ref >39)
LDL Chol Calc (NIH): 98 mg/dL (ref 0–99)
Triglycerides: 69 mg/dL (ref 0–149)
VLDL Cholesterol Cal: 14 mg/dL (ref 5–40)

## 2021-10-04 LAB — VITAMIN D 25 HYDROXY (VIT D DEFICIENCY, FRACTURES): Vit D, 25-Hydroxy: 75 ng/mL (ref 30.0–100.0)

## 2021-11-24 ENCOUNTER — Other Ambulatory Visit: Payer: Self-pay | Admitting: Medical

## 2022-02-23 ENCOUNTER — Encounter: Payer: Self-pay | Admitting: Obstetrics and Gynecology

## 2022-02-23 ENCOUNTER — Other Ambulatory Visit (HOSPITAL_COMMUNITY)
Admission: RE | Admit: 2022-02-23 | Discharge: 2022-02-23 | Disposition: A | Discharge status: Home / Self Care | Payer: BC Managed Care – PPO | Source: Ambulatory Visit | Attending: Obstetrics and Gynecology | Admitting: Obstetrics and Gynecology

## 2022-02-23 ENCOUNTER — Ambulatory Visit (INDEPENDENT_AMBULATORY_CARE_PROVIDER_SITE_OTHER): Payer: BC Managed Care – PPO | Admitting: Obstetrics and Gynecology

## 2022-02-23 VITALS — BP 102/71 | HR 75 | Ht 66.0 in | Wt 197.0 lb

## 2022-02-23 DIAGNOSIS — N750 Cyst of Bartholin's gland: Secondary | ICD-10-CM | POA: Diagnosis not present

## 2022-02-23 DIAGNOSIS — F419 Anxiety disorder, unspecified: Secondary | ICD-10-CM | POA: Diagnosis not present

## 2022-02-23 DIAGNOSIS — Z01419 Encounter for gynecological examination (general) (routine) without abnormal findings: Secondary | ICD-10-CM | POA: Insufficient documentation

## 2022-02-23 DIAGNOSIS — Z975 Presence of (intrauterine) contraceptive device: Secondary | ICD-10-CM

## 2022-02-23 DIAGNOSIS — Z124 Encounter for screening for malignant neoplasm of cervix: Secondary | ICD-10-CM

## 2022-02-23 NOTE — Progress Notes (Signed)
Patient presents for Annual Exam today.  LMP: No periods w/ Nexplanon may have spotting. Last pap: 07/07/2020 pt would like pap today.  Contraception:Nexplanon STD Screening: Declines  Family Hx of Breast Cancer: None  CC: a few wks ago pt had pelvic pain concerned if it was a cyst hx of cyst in the past no discomfort today.

## 2022-02-23 NOTE — Progress Notes (Addendum)
Obstetrics and Gynecology Annual Patient Evaluation  Appointment Date: 02/23/2022  OBGYN Clinic: [facility]  Primary Care Provider: Jac Canavan  Referring Provider: Jac Canavan, PA-C  Chief Complaint:  Chief Complaint  Patient presents with   Gynecologic Exam    History of Present Illness: Nancy Jenkins is a 36 y.o. [id number] (No LMP recorded. Patient has had an implant.), seen for the above chief complaint. Her past medical history is significant for h/o bartholin cysts  Patient had an episode about 3 weeks ago of intense pain during intercourse in the rlq that radiated throughout her abdomen. This got better but persisted for a few weeks. Home UPT neg  She has rare slight periods (cramping, spotting) with the nexplanon in place.   Review of Systems: Pertinent items noted in HPI and remainder of comprehensive ROS otherwise negative.   Patient Active Problem List   Diagnosis Date Noted   Abnormal thyroid blood test 10/03/2021   Encounter for health maintenance examination in adult 11/11/2020   Vitamin D deficiency 11/11/2020   PCOS (polycystic ovarian syndrome) 09/29/2020   Keratosis pilaris 09/29/2020   Nexplanon in place 08/07/2019   Overweight (BMI 25.0-29.9) 04/03/2019   Cyst of left Bartholin's gland 04/03/2019   High risk medication use 04/03/2019   Screening for lipid disorders 06/26/2018   Gastroesophageal reflux disease without esophagitis 06/26/2018   Night terrors 06/26/2018    Past Medical History:  Past Medical History:  Diagnosis Date   GERD (gastroesophageal reflux disease)    Night terrors    Tension headache    Thyroid activity decreased    in past   Wears contact lenses     Past Surgical History:  Past Surgical History:  Procedure Laterality Date   COLONOSCOPY  2010   along with EGD, Dr. Clent Ridges   ESOPHAGOGASTRODUODENOSCOPY  2010   biopsy, no h pylori, Dr. Clent Ridges    Past Obstetrical History:  OB  History  Gravida Para Term Preterm AB Living  2 2 2     2   SAB IAB Ectopic Multiple Live Births          2    # Outcome Date GA Lbr Len/2nd Weight Sex Delivery Anes PTL Lv  2 Term           1 Term             Obstetric Comments  SVD x 2    Past Gynecological History: As per HPI. History of Pap Smear(s): Yes.   Last pap 06/2020, which was negative cytology and hpv Nexplanon placed 06/2020  Social History:  Social History   Socioeconomic History   Marital status: Married    Spouse name: Not on file   Number of children: Not on file   Years of education: Not on file   Highest education level: Not on file  Occupational History   Not on file  Tobacco Use   Smoking status: Never   Smokeless tobacco: Never  Vaping Use   Vaping Use: Never used  Substance and Sexual Activity   Alcohol use: Yes    Alcohol/week: 1.0 standard drink of alcohol    Types: 1 Shots of liquor per week    Comment: occasional beer   Drug use: Not Currently   Sexual activity: Yes    Partners: Male    Birth control/protection: Implant    Comment: nexplanon  Other Topics Concern   Not on file  Social History Narrative   Married,  has son, daughter.  Exercise - biking, hiking, active.   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   [employer].  09/2021   Social Determinants of Health   Financial Resource Strain: Not on file  Food Insecurity: Not on file  Transportation Needs: Not on file  Physical Activity: Not on file  Stress: Not on file  Social Connections: Not on file  Intimate Partner Violence: Not on file    Family History:  Family History  Problem Relation Age of Onset   Diabetes Mother    Depression Mother    Hypertension Father    Deep vein thrombosis Father    Ulcerative colitis Father    Heart disease Brother        congenital   Hypertension Maternal Grandmother    Cancer Maternal Grandfather        larynx   Hypertension Paternal Grandmother    Stroke Paternal Grandmother     Cancer Paternal Grandfather        liver   Medications Nancy Jenkins had no medications administered during this visit. Current Outpatient Medications  Medication Sig Dispense Refill   cholecalciferol (VITAMIN D3) 25 MCG (1000 UNIT) tablet Take 1 tablet (1,000 Units total) by mouth daily. 90 tablet 3   clonazePAM (KLONOPIN) 0.5 MG tablet TAKE 1 TABLET BY MOUTH DAILY 90 tablet 0   etonogestrel (NEXPLANON) 68 MG IMPL implant 1 each by Subdermal route once.     omeprazole (PRILOSEC) 20 MG capsule TAKE 1 CAPSULE BY MOUTH  DAILY 90 capsule 3   Insulin Pen Needle (BD PEN NEEDLE NANO U/F) 32G X 4 MM MISC 1 each by Does not apply route at bedtime. 30 each 5   TRULICITY 1.5 MG/0.5ML SOPN INJECT THE CONTENTS OF ONE  PEN SUBCUTANEOUSLY WEEKLY  AS DIRECTED 6 mL 1   No current facility-administered medications for this visit.    Allergies Patient has no known allergies.   Physical Exam:  BP 102/71   Pulse 75   Ht 5\' 6"  (1.676 m)   Wt 197 lb (89.4 kg)   BMI 31.80 kg/m  Body mass index is 31.8 kg/m.  General appearance: Well nourished, well developed female in no acute distress.  Neck:  Supple, normal appearance, and no thyromegaly  Cardiovascular: normal s1 and s2.  No murmurs, rubs or gallops. Respiratory:  Clear to auscultation bilateral. Normal respiratory effort Abdomen: positive bowel sounds and no masses, hernias; diffusely non tender to palpation, non distended Breasts: breasts appear normal, no suspicious masses, no skin or nipple changes or axillary nodes, and normal palpation. Neuro/Psych:  Normal mood and affect.  Skin:  Warm and dry.  Lymphatic:  No inguinal lymphadenopathy.   Pelvic exam: is not limited by body habitus EGBUS: within normal limits Vagina: within normal limits and with no blood or discharge in the vault Cervix: normal appearing cervix without tenderness, discharge or lesions. Uterus:  nonenlarged and non tender Adnexa:  normal adnexa and no mass, fullness,  tenderness Rectovaginal: deferred  Laboratory: none  Radiology: none  Assessment: pt doing well  Plan:  1. Well woman exam with routine gynecological exam Pt aware pap is a little on the early side and may not be covered by insurance Abdominal pain sounds cyst related; low risk for recurrence. Pt to monitor - Cytology - PAP  2. Nexplanon in place Good until October 2024  3. Cyst of left Bartholin's gland No recent events; able to be resolved with home care thus far  4.  Cervical cancer screening - Cytology - PAP  No orders of the defined types were placed in this encounter.   RTC 1 year  [place] Bingharlie Amri Lien, Montez HagemanJr MD Attending [facility](Faculty Practice)

## 2022-02-24 LAB — CYTOLOGY - PAP
Comment: NEGATIVE
Diagnosis: NEGATIVE
High risk HPV: NEGATIVE

## 2022-02-27 DIAGNOSIS — F419 Anxiety disorder, unspecified: Secondary | ICD-10-CM | POA: Diagnosis not present

## 2022-03-30 ENCOUNTER — Encounter: Payer: Self-pay | Admitting: Obstetrics and Gynecology

## 2022-03-31 ENCOUNTER — Other Ambulatory Visit: Payer: Self-pay

## 2022-03-31 DIAGNOSIS — R102 Pelvic and perineal pain: Secondary | ICD-10-CM

## 2022-03-31 NOTE — Progress Notes (Signed)
Pt sent message regarding pelvic pain ok per Dr.Pickens to place order for GYN U/S. Mychart message sent to pt.

## 2022-04-04 ENCOUNTER — Other Ambulatory Visit: Payer: Self-pay | Admitting: Medical

## 2022-04-04 MED ORDER — CLONAZEPAM 0.5 MG PO TABS: 0.5000 mg | ORAL_TABLET | Freq: Every day | ORAL | 0 refills | Status: DC

## 2022-04-06 ENCOUNTER — Ambulatory Visit
Admission: RE | Admit: 2022-04-06 | Discharge: 2022-04-06 | Disposition: A | Discharge status: Home / Self Care | Payer: BC Managed Care – PPO | Source: Ambulatory Visit | Attending: Obstetrics and Gynecology | Admitting: Obstetrics and Gynecology

## 2022-04-06 ENCOUNTER — Encounter: Payer: Self-pay | Admitting: Obstetrics and Gynecology

## 2022-04-06 DIAGNOSIS — R102 Pelvic and perineal pain: Secondary | ICD-10-CM | POA: Insufficient documentation

## 2022-04-06 DIAGNOSIS — N83202 Unspecified ovarian cyst, left side: Secondary | ICD-10-CM | POA: Diagnosis not present

## 2022-05-03 ENCOUNTER — Telehealth: Payer: Self-pay

## 2022-05-03 NOTE — Telephone Encounter (Signed)
Left message for pt to call office back regarding f/u appt w/ Dr. Vergie Living.

## 2022-05-24 ENCOUNTER — Encounter: Payer: Self-pay | Admitting: Internal Medicine

## 2022-06-07 DIAGNOSIS — M62431 Contracture of muscle, right forearm: Secondary | ICD-10-CM | POA: Diagnosis not present

## 2022-06-07 DIAGNOSIS — M9907 Segmental and somatic dysfunction of upper extremity: Secondary | ICD-10-CM | POA: Diagnosis not present

## 2022-06-07 DIAGNOSIS — M9902 Segmental and somatic dysfunction of thoracic region: Secondary | ICD-10-CM | POA: Diagnosis not present

## 2022-06-07 DIAGNOSIS — M62830 Muscle spasm of back: Secondary | ICD-10-CM | POA: Diagnosis not present

## 2022-06-14 DIAGNOSIS — M62431 Contracture of muscle, right forearm: Secondary | ICD-10-CM | POA: Diagnosis not present

## 2022-06-14 DIAGNOSIS — M9902 Segmental and somatic dysfunction of thoracic region: Secondary | ICD-10-CM | POA: Diagnosis not present

## 2022-06-14 DIAGNOSIS — M62830 Muscle spasm of back: Secondary | ICD-10-CM | POA: Diagnosis not present

## 2022-06-14 DIAGNOSIS — M9907 Segmental and somatic dysfunction of upper extremity: Secondary | ICD-10-CM | POA: Diagnosis not present

## 2022-06-19 DIAGNOSIS — M9902 Segmental and somatic dysfunction of thoracic region: Secondary | ICD-10-CM | POA: Diagnosis not present

## 2022-06-19 DIAGNOSIS — M62830 Muscle spasm of back: Secondary | ICD-10-CM | POA: Diagnosis not present

## 2022-06-19 DIAGNOSIS — M9907 Segmental and somatic dysfunction of upper extremity: Secondary | ICD-10-CM | POA: Diagnosis not present

## 2022-06-19 DIAGNOSIS — M62431 Contracture of muscle, right forearm: Secondary | ICD-10-CM | POA: Diagnosis not present

## 2022-06-20 ENCOUNTER — Other Ambulatory Visit (HOSPITAL_COMMUNITY)
Admission: RE | Admit: 2022-06-20 | Discharge: 2022-06-20 | Disposition: A | Discharge status: Home / Self Care | Payer: BC Managed Care – PPO | Source: Ambulatory Visit | Attending: Obstetrics and Gynecology | Admitting: Obstetrics and Gynecology

## 2022-06-20 ENCOUNTER — Ambulatory Visit (INDEPENDENT_AMBULATORY_CARE_PROVIDER_SITE_OTHER): Payer: BC Managed Care – PPO | Admitting: Obstetrics and Gynecology

## 2022-06-20 ENCOUNTER — Ambulatory Visit: Payer: Self-pay

## 2022-06-20 ENCOUNTER — Encounter: Payer: Self-pay | Admitting: Obstetrics and Gynecology

## 2022-06-20 VITALS — BP 111/72 | HR 72 | Wt 202.0 lb

## 2022-06-20 DIAGNOSIS — Z975 Presence of (intrauterine) contraceptive device: Secondary | ICD-10-CM

## 2022-06-20 DIAGNOSIS — N941 Unspecified dyspareunia: Secondary | ICD-10-CM | POA: Diagnosis not present

## 2022-06-20 DIAGNOSIS — N921 Excessive and frequent menstruation with irregular cycle: Secondary | ICD-10-CM | POA: Diagnosis not present

## 2022-06-20 NOTE — Progress Notes (Signed)
  Obstetrics and Gynecology Visit Return Patient Evaluation  Appointment Date: 06/20/2022  Primary Care Provider: Tysinger, David [place] [facility]  Chief Complaint: pelvic pain follow up  History of Present Illness:  Nancy Jenkins is a 36 y.o. with above CC. Patient last seen for annuall exam in June this year and she noted pain with sex. Ultrasound ordered which showed a 2cm simple RO cyst. S/s continue.   She has a nexplanon in place and no periods on that but she has more random spotting. Nexplanon was placed October 2021. She's had nexplanon before and didn't have that last time.   Review of Systems: as noted in the History of Present Illness.  Patient Active Problem List   Diagnosis Date Noted   Abnormal thyroid blood test 10/03/2021   Encounter for health maintenance examination in adult 11/11/2020   Vitamin D deficiency 11/11/2020   PCOS (polycystic ovarian syndrome) 09/29/2020   Keratosis pilaris 09/29/2020   Nexplanon in place 08/07/2019   Overweight (BMI 25.0-29.9) 04/03/2019   Cyst of left Bartholin's gland 04/03/2019   High risk medication use 04/03/2019   Screening for lipid disorders 06/26/2018   Gastroesophageal reflux disease without esophagitis 06/26/2018   Night terrors 06/26/2018   Medications:  [name] had no medications administered during this visit. Current Outpatient Medications  Medication Sig Dispense Refill   cholecalciferol (VITAMIN D3) 25 MCG (1000 UNIT) tablet Take 1 tablet (1,000 Units total) by mouth daily. 90 tablet 3   clonazePAM (KLONOPIN) 0.5 MG tablet Take 1 tablet (0.5 mg total) by mouth daily. 90 tablet 0   etonogestrel (NEXPLANON) 68 MG IMPL implant 1 each by Subdermal route once.     omeprazole (PRILOSEC) 20 MG capsule TAKE 1 CAPSULE BY MOUTH  DAILY 90 capsule 3   Insulin Pen Needle (BD PEN NEEDLE NANO U/F) 32G X 4 MM MISC 1 each by Does not apply route at bedtime. 30 each 5   TRULICITY  1.5 AV/4.0JW SOPN INJECT THE CONTENTS OF ONE  PEN SUBCUTANEOUSLY WEEKLY  AS DIRECTED 6 mL 1   No current facility-administered medications for this visit.    Allergies: has No Known Allergies.  Physical Exam:  BP 111/72   Pulse 72   Wt 202 lb (91.6 kg)   BMI 32.60 kg/m  Body mass index is 32.6 kg/m. General appearance: Well nourished, well developed female in no acute distress.  Abdomen: diffusely non tender to palpation, non distended, and no masses, hernias Neuro/Psych:  Normal mood and affect.    Pelvic exam:  EGBUS: normal  Bedside u/s transvaginal: bladder still full even though patient just voided. Uterus, small, normal, thin stripe, normal RO with a few follicles, LO with 2cm simple cyst, no free fluid in the pelvis.   Labs:  UPT negative  Assessment: patient stable  Plan:  1. Breakthrough bleeding on Nexplanon - Cervicovaginal ancillary only( [place])  2. Dyspareunia in female I told her I recommend pelvic PT referral given bladder findings. She states that she doesn't feel like she has to void again and that she felt like she fully emptied bladder. I feel the cysts are incidental and not the cause of the pain - Cervicovaginal ancillary only( [place]) - Urine Culture   RTC: PRN  Durene Romans MD Attending Center for [facility])

## 2022-06-21 LAB — CERVICOVAGINAL ANCILLARY ONLY
Bacterial Vaginitis (gardnerella): NEGATIVE
Candida Glabrata: NEGATIVE
Candida Vaginitis: NEGATIVE
Chlamydia: NEGATIVE
Comment: NEGATIVE
Comment: NEGATIVE
Comment: NEGATIVE
Comment: NEGATIVE
Comment: NEGATIVE
Comment: NORMAL
Neisseria Gonorrhea: NEGATIVE
Trichomonas: NEGATIVE

## 2022-06-21 LAB — URINE CULTURE

## 2022-06-22 LAB — LAB REPORT - SCANNED
A1c: 5.4
EGFR: 85

## 2022-06-27 ENCOUNTER — Encounter: Payer: Self-pay | Admitting: Internal Medicine

## 2022-06-28 DIAGNOSIS — M9907 Segmental and somatic dysfunction of upper extremity: Secondary | ICD-10-CM | POA: Diagnosis not present

## 2022-06-28 DIAGNOSIS — M9902 Segmental and somatic dysfunction of thoracic region: Secondary | ICD-10-CM | POA: Diagnosis not present

## 2022-06-28 DIAGNOSIS — M62431 Contracture of muscle, right forearm: Secondary | ICD-10-CM | POA: Diagnosis not present

## 2022-06-28 DIAGNOSIS — M62830 Muscle spasm of back: Secondary | ICD-10-CM | POA: Diagnosis not present

## 2022-07-03 ENCOUNTER — Other Ambulatory Visit: Payer: Self-pay | Admitting: Medical

## 2022-07-03 MED ORDER — CLONAZEPAM 0.5 MG PO TABS: 0.5000 mg | ORAL_TABLET | Freq: Every day | ORAL | 0 refills | Status: DC

## 2022-07-05 ENCOUNTER — Other Ambulatory Visit: Payer: Self-pay | Admitting: Medical

## 2022-07-05 DIAGNOSIS — K219 Gastro-esophageal reflux disease without esophagitis: Secondary | ICD-10-CM

## 2022-07-07 DIAGNOSIS — M9907 Segmental and somatic dysfunction of upper extremity: Secondary | ICD-10-CM | POA: Diagnosis not present

## 2022-07-07 DIAGNOSIS — M9902 Segmental and somatic dysfunction of thoracic region: Secondary | ICD-10-CM | POA: Diagnosis not present

## 2022-07-07 DIAGNOSIS — M62431 Contracture of muscle, right forearm: Secondary | ICD-10-CM | POA: Diagnosis not present

## 2022-07-07 DIAGNOSIS — M62830 Muscle spasm of back: Secondary | ICD-10-CM | POA: Diagnosis not present

## 2022-07-11 NOTE — Therapy (Unsigned)
OUTPATIENT PHYSICAL THERAPY FEMALE PELVIC EVALUATION   Patient Name: Nancy Jenkins MRN: [id number] DOB:09/17/1986, 36 y.o., female Today's Date: 07/12/2022   PT End of Session - 07/12/22 1316     Visit Number 1    Date for PT Re-Evaluation 10/04/22    Authorization Type BCBS    Authorization - Visit Number 1    Authorization - Number of Visits 20    PT Start Time 4401    PT Stop Time 1225    PT Time Calculation (min) 40 min    Activity Tolerance Patient tolerated treatment well    Behavior During Therapy WFL for tasks assessed/performed             Past Medical History:  Diagnosis Date   GERD (gastroesophageal reflux disease)    Night terrors    Tension headache    Thyroid activity decreased    in past   Wears contact lenses    Past Surgical History:  Procedure Laterality Date   COLONOSCOPY  2010   along with EGD, Dr. Volanda Napoleon   ESOPHAGOGASTRODUODENOSCOPY  2010   biopsy, no h pylori, Dr. Volanda Napoleon   Patient Active Problem List   Diagnosis Date Noted   Abnormal thyroid blood test 10/03/2021   Encounter for health maintenance examination in adult 11/11/2020   Vitamin D deficiency 11/11/2020   PCOS (polycystic ovarian syndrome) 09/29/2020   Keratosis pilaris 09/29/2020   Nexplanon in place 08/07/2019   Overweight (BMI 25.0-29.9) 04/03/2019   Cyst of left Bartholin's gland 04/03/2019   High risk medication use 04/03/2019   Screening for lipid disorders 06/26/2018   Gastroesophageal reflux disease without esophagitis 06/26/2018   Night terrors 06/26/2018    PCP: Carlena Hurl, PA-C  REFERRING PROVIDER: Aletha Halim, MD  REFERRING DIAG: N94.10 (ICD-10-CM) - Dyspareunia in female  THERAPY DIAG:  Other muscle spasm  Stiffness of right hip, not elsewhere classified  Muscle weakness (generalized)  Rationale for Evaluation and Treatment Rehabilitation  ONSET DATE: 01/2022  SUBJECTIVE:                                                                                                                                                                                            SUBJECTIVE STATEMENT: Patient has had pain with intercourse off and on for several years. Patient had difficulty emptying her bladder after the the bladder scan. Patient will have popping to the right hip after sitting for awhile.   PAIN:  Are you having pain? Yes see intercourse section  PRECAUTIONS: None  WEIGHT BEARING RESTRICTIONS: No  FALLS:  Has patient fallen in last 6 months? 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   PERTINENT HISTORY:  PDOS;   BOWEL MOVEMENT: No issues   URINATION: Pain with urination: No Fully empty bladder: Yes: feels like she is but test show she is not.  Stream: Strong Urgency: No Frequency: good Leakage:  just when she has to really have to go and traveling Pads: No  INTERCOURSE: Pain with intercourse: Initial Penetration, During Penetration, and feels the pain at level 7/10 in the suprapubic area and deeper penetration is worse.  Ability to have vaginal penetration:  Yes:   Climax: yes Marinoff Scale: 1/3  PREGNANCY: Vaginal deliveries 2 Tearing No  OBJECTIVE:   DIAGNOSTIC FINDINGS:  Ultrasound showed her bladder half full after she urinated  COGNITION: Overall cognitive status: Within functional limits for tasks assessed     SENSATION: Light touch: Appears intact Proprioception: Appears intact    LUMBAR SPECIAL TESTS:  SI Compression/distraction test: Negative and FABER test: Negative   POSTURE: No Significant postural limitations  PELVIC ALIGNMENT: ASIS are equal  LUMBARAROM/PROM:Lumbar ROM is full  LOWER EXTREMITY ROM:  Passive ROM Right eval Left eval  Hip flexion 120 125  Hip internal rotation 20 27  Hip external rotation 60 80   (Blank rows  = not tested)  LOWER EXTREMITY MMT:  MMT Right eval Left eval  Hip extension 4/5 5/5  Hip abduction 4-/5 4/5  Hip adduction 4/5 4/5   PALPATION:   General  right gluteus medius, right piriformis, right posterior hip, right quadratus                External Perineal Exam intact and good coloring                             Internal Pelvic Floor bilateral iliococcygeus, obturator internist, right side of the bladder has restrictions  Patient confirms identification and approves PT to assess internal pelvic floor and treatment Yes  PELVIC MMT:   MMT eval  Vaginal 2/5 weak hug  (Blank rows = not tested)        TONE: Increased tone with right more than left  PROLAPSE: none  TODAY'S TREATMENT:                                                                                                                   DATE: 07/12/2022 EVAL See below   PATIENT EDUCATION:  Education details: Access Code: [email or web address] Person educated: Patient Education method: Explanation, Demonstration, Tactile cues, Verbal cues, and Handouts Education comprehension: verbalized understanding, returned demonstration, verbal cues required, tactile cues required, and needs further education  HOME EXERCISE PROGRAM: 07/12/2022 Access Code: [facility] URL: [email or web address] Date: 07/12/2022 Prepared by: Eulis Foster  Exercises - Pigeon Pose  - 1 x daily - 7 x weekly - 1 sets - 2 reps - 30 sec hold - Supine Piriformis Stretch  with Leg Straight  - 1 x daily - 7 x weekly - 1 sets - 2 reps - 3 sec hold  ASSESSMENT:  CLINICAL IMPRESSION: Patient is a 36 y.o. female who was seen today for physical therapy evaluation and treatment for Dyspareunia. Patient reports 7/10 pain with deeper penetration. The pain is suprapubically or deep muscles. Pelvic floor strength is 2/5 with weak hug of therapist finger. She has tenderness located bilateral iliococcygeus, obturator internist, and right side of the  bladder. She has tenderness located on the right gluteus medius, piriformis, and posterior right hip. Patient has decreased right hip external rotation and flexion. She has weakness of the right hip. Patient will benefit from skilled therapy to reduce her pain in right hip and lower abdominal areas to improve quality of life.   OBJECTIVE IMPAIRMENTS: decreased coordination, decreased strength, increased fascial restrictions, increased muscle spasms, and pain.   ACTIVITY LIMITATIONS: sitting  PARTICIPATION LIMITATIONS: interpersonal relationship and driving  PERSONAL FACTORS: Fitness and 1 comorbidity: PCOS  are also affecting patient's functional outcome.   REHAB POTENTIAL: Excellent  CLINICAL DECISION MAKING: Stable/uncomplicated  EVALUATION COMPLEXITY: Low   GOALS: Goals reviewed with patient? Yes  SHORT TERM GOALS: Target date: 08/09/2022  Patient independent with initial HEP for hip stretches to improve tissue mobility.  Baseline: Goal status: INITIAL  2.  Patient reports pain with intercourse decreased >/= 25% due to reduced trigger points in the muscles.  Baseline:  Goal status: INITIAL  3.  Patient right hip pain decreased >/= 25%  due to improved flexion and external rotation Baseline:  Goal status: INITIAL   LONG TERM GOALS: Target date: 10/04/2022   Patient is independent with advanced HEP for pelvic floor strength and improved right hip strength . Baseline:  Goal status: INITIAL  2.  Patient right hip flexion increased >/= 125 and external rotation increased >/= 80 degrees to reduce the clicking in the right hip after sitting.  Baseline:  Goal status: INITIAL  3.  Patient able to have penile penetration vaginally with pain decreased </= 1/10 due to improved lengthening of the pelvic floor muscles.  Baseline:  Goal status: INITIAL  4.  Patient able to fully empty her bladder due to reduced muscle tone of the pelvic floor.  Baseline:  Goal status:  INITIAL   PLAN:  PT FREQUENCY: 1x/week  PT DURATION: 12 weeks  PLANNED INTERVENTIONS: Therapeutic exercises, Therapeutic activity, Neuromuscular re-education, Patient/Family education, Self Care, Joint mobilization, Dry Needling, Electrical stimulation, Cryotherapy, Moist heat, Taping, Biofeedback, and Manual therapy  PLAN FOR NEXT SESSION: dry needling to right hip, right hip mobilization, manual therapy to pelvic floor and right side of bladder   Eulis Foster, PT 07/12/22 3:42 PM

## 2022-07-12 ENCOUNTER — Ambulatory Visit: Payer: BC Managed Care – PPO | Attending: Obstetrics and Gynecology | Admitting: Physical Therapy

## 2022-07-12 ENCOUNTER — Encounter: Payer: Self-pay | Admitting: Physical Therapy

## 2022-07-12 ENCOUNTER — Other Ambulatory Visit: Payer: Self-pay

## 2022-07-12 DIAGNOSIS — N941 Unspecified dyspareunia: Secondary | ICD-10-CM | POA: Insufficient documentation

## 2022-07-12 DIAGNOSIS — M62838 Other muscle spasm: Secondary | ICD-10-CM

## 2022-07-12 DIAGNOSIS — M6281 Muscle weakness (generalized): Secondary | ICD-10-CM

## 2022-07-12 DIAGNOSIS — M25651 Stiffness of right hip, not elsewhere classified: Secondary | ICD-10-CM

## 2022-07-13 DIAGNOSIS — M62830 Muscle spasm of back: Secondary | ICD-10-CM | POA: Diagnosis not present

## 2022-07-13 DIAGNOSIS — M9907 Segmental and somatic dysfunction of upper extremity: Secondary | ICD-10-CM | POA: Diagnosis not present

## 2022-07-13 DIAGNOSIS — M62431 Contracture of muscle, right forearm: Secondary | ICD-10-CM | POA: Diagnosis not present

## 2022-07-13 DIAGNOSIS — M9902 Segmental and somatic dysfunction of thoracic region: Secondary | ICD-10-CM | POA: Diagnosis not present

## 2022-07-18 IMAGING — CR DG TIBIA/FIBULA 2V*R*
1 series · 4 of 4 positions shown · non-contrast
Comparison: None.

CLINICAL DATA: Right leg pain since training for a run. Pain
reported in the right ankle as well as posterior lower leg. No
specific injury.

EXAM:
RIGHT TIBIA AND FIBULA - 2 VIEW

[Series 1: dg tibia/fibula right · 0.14mm/px · 4 of 4 slices shown]
[im 1/4]
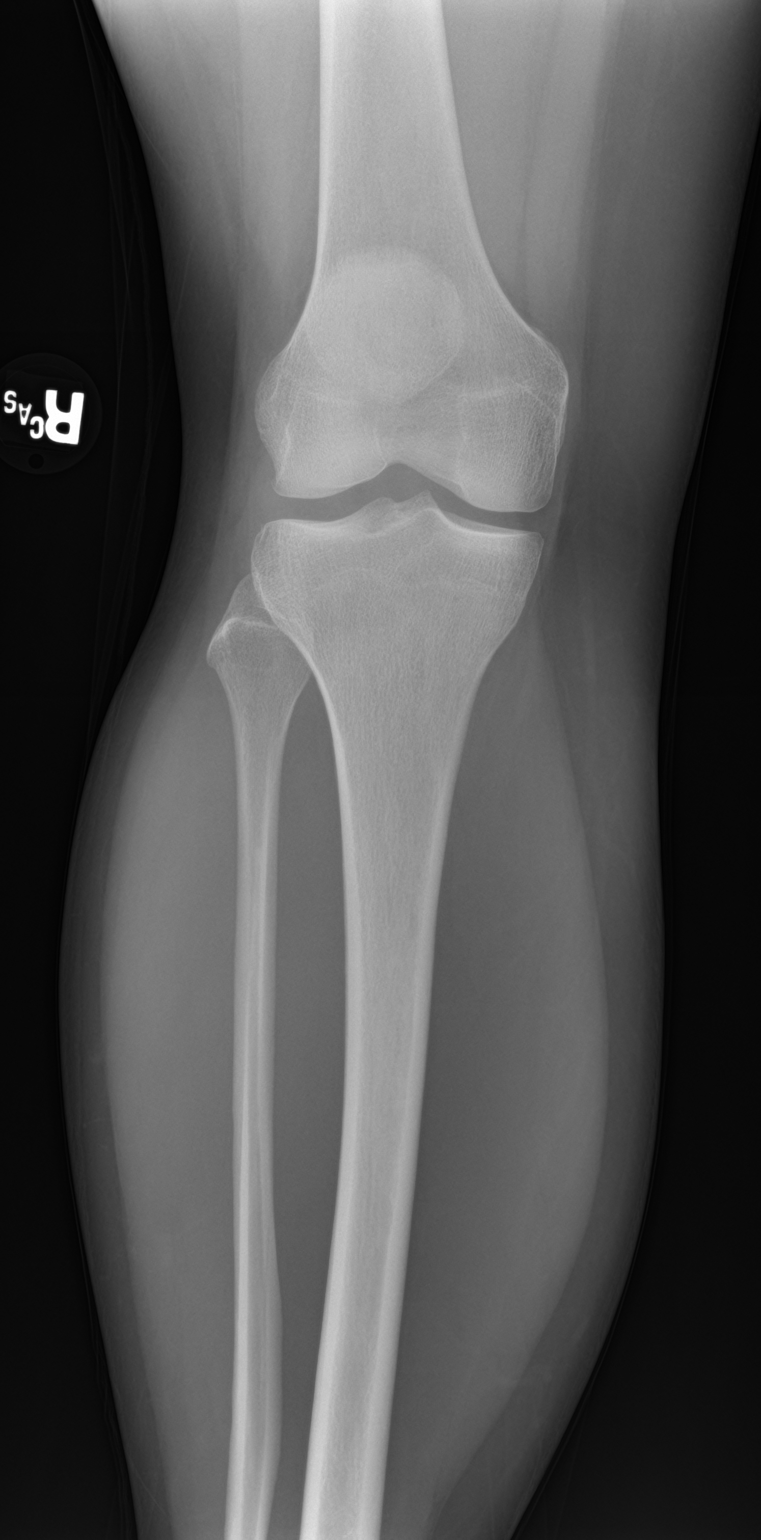
[im 2/4]
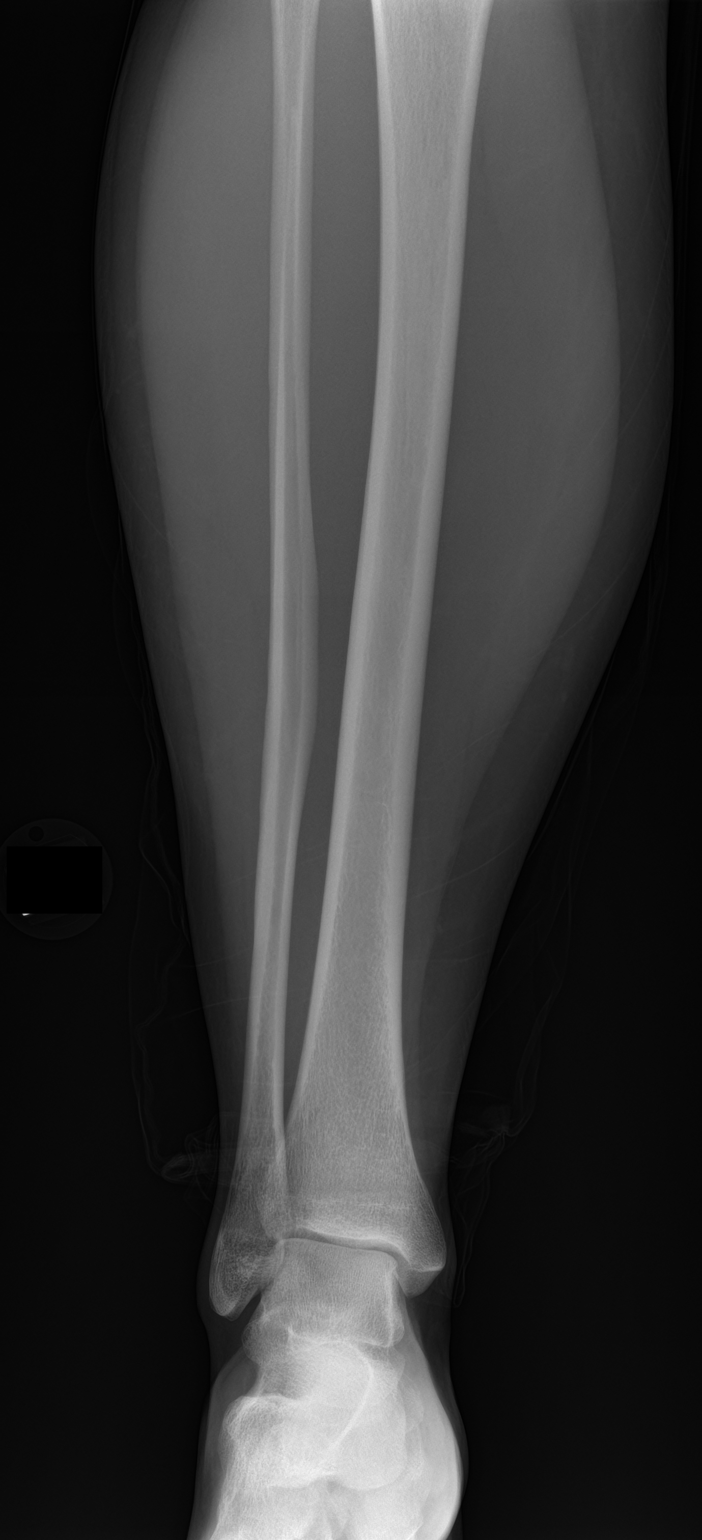
[im 3/4]
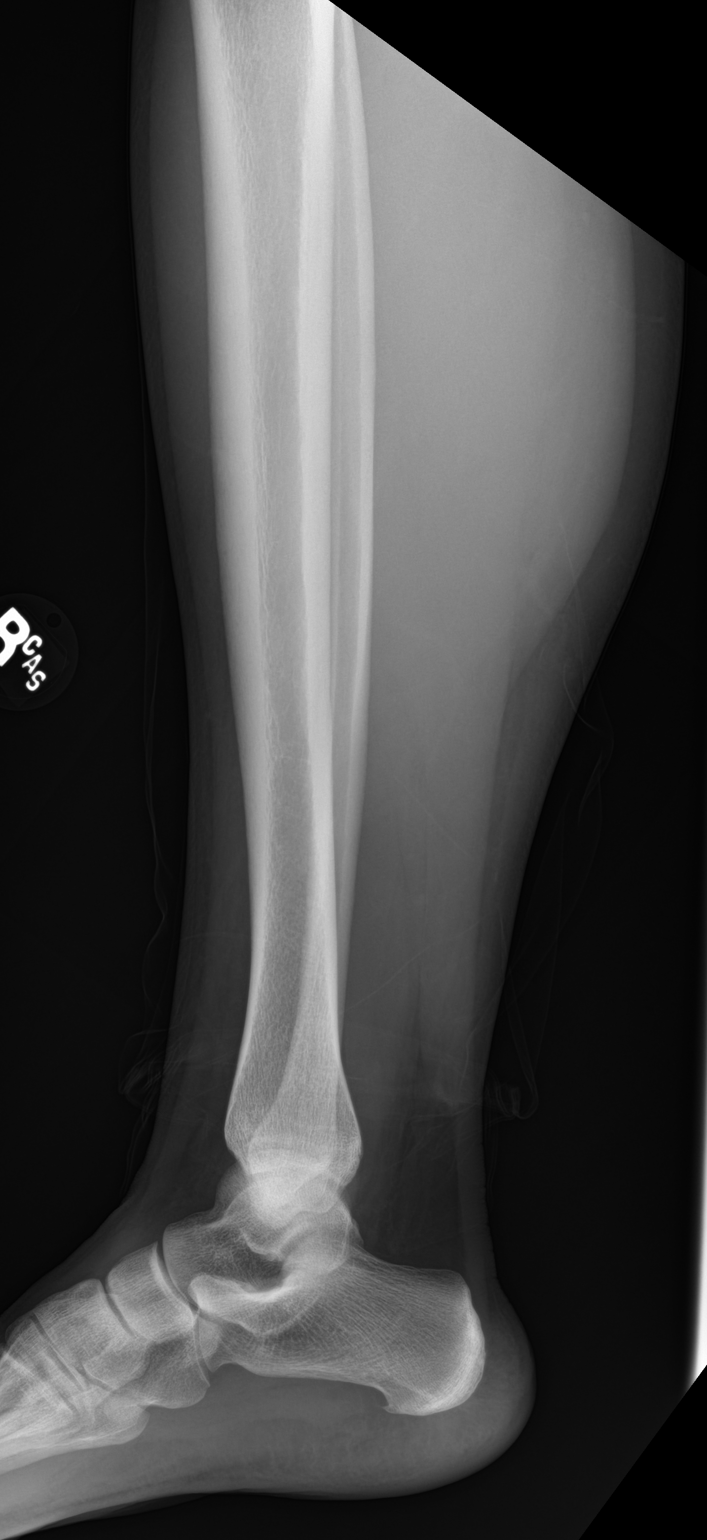
[im 4/4]
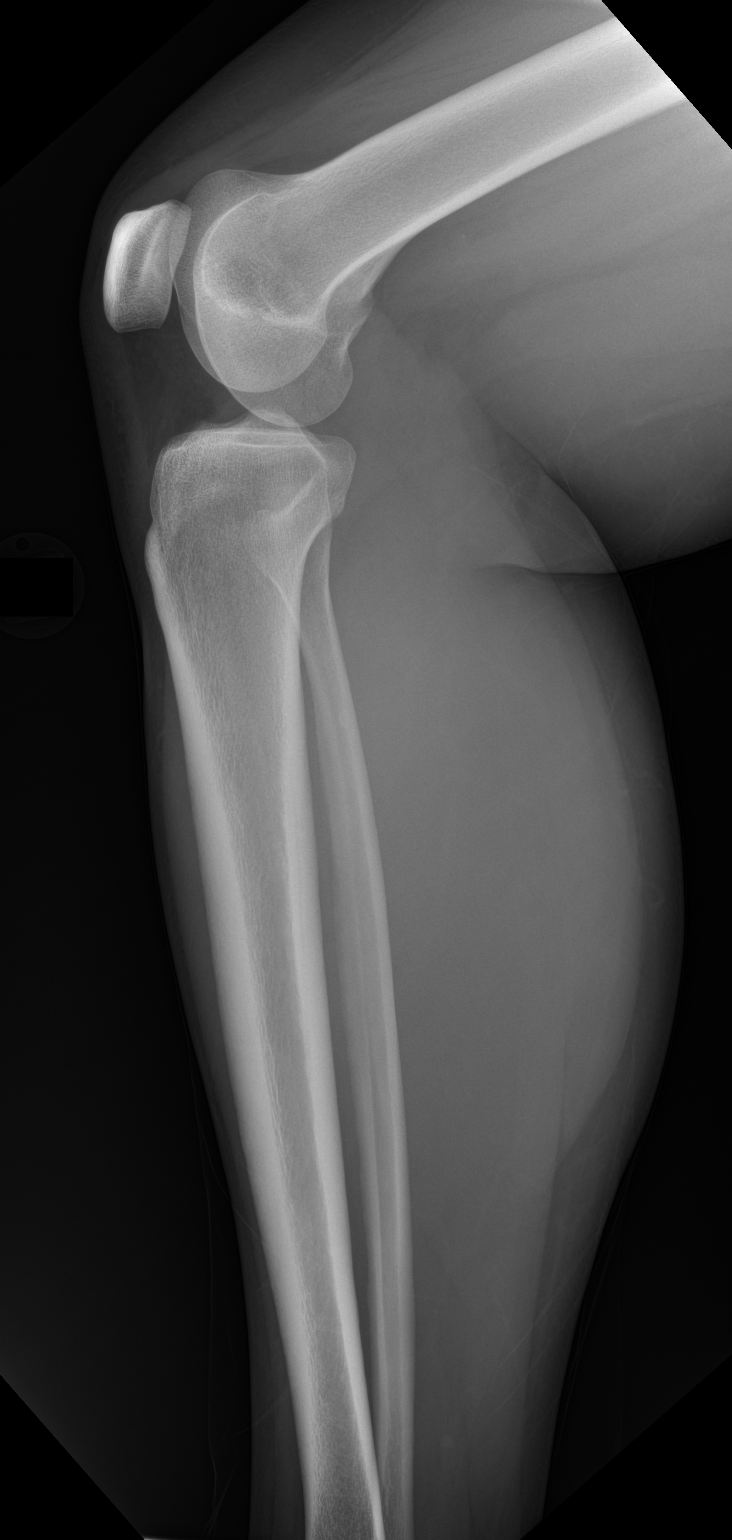

[4 of 4 positions shown; findings below may reference images not displayed]

FINDINGS: There is no evidence of fracture or other focal bone lesions. Soft
tissues are unremarkable.
IMPRESSION: Negative.

## 2022-07-26 ENCOUNTER — Encounter: Payer: Self-pay | Admitting: Physical Therapy

## 2022-07-26 ENCOUNTER — Ambulatory Visit: Payer: BC Managed Care – PPO | Attending: Obstetrics and Gynecology | Admitting: Physical Therapy

## 2022-07-26 DIAGNOSIS — M62838 Other muscle spasm: Secondary | ICD-10-CM | POA: Insufficient documentation

## 2022-07-26 DIAGNOSIS — M25651 Stiffness of right hip, not elsewhere classified: Secondary | ICD-10-CM | POA: Insufficient documentation

## 2022-07-26 DIAGNOSIS — M6281 Muscle weakness (generalized): Secondary | ICD-10-CM | POA: Insufficient documentation

## 2022-07-26 NOTE — Therapy (Signed)
OUTPATIENT PHYSICAL THERAPY TREATMENT NOTE   Patient Name: Nancy Jenkins MRN: [id number] DOB:1985-11-18, 36 y.o., female Today's Date: 07/26/2022  PCP:  Carlena Hurl, PA-C  REFERRING PROVIDER: Aletha Halim, MD   END OF SESSION:   PT End of Session - 07/26/22 1010     Visit Number 2    Date for PT Re-Evaluation 10/04/22    Authorization Type BCBS    Authorization - Visit Number 2    Authorization - Number of Visits 20    PT Start Time 1012    PT Stop Time 1055    PT Time Calculation (min) 43 min    Activity Tolerance Patient tolerated treatment well    Behavior During Therapy WFL for tasks assessed/performed             Past Medical History:  Diagnosis Date   GERD (gastroesophageal reflux disease)    Night terrors    Tension headache    Thyroid activity decreased    in past   Wears contact lenses    Past Surgical History:  Procedure Laterality Date   COLONOSCOPY  2010   along with EGD, Dr. Volanda Napoleon   ESOPHAGOGASTRODUODENOSCOPY  2010   biopsy, no h pylori, Dr. Volanda Napoleon   Patient Active Problem List   Diagnosis Date Noted   Abnormal thyroid blood test 10/03/2021   Encounter for health maintenance examination in adult 11/11/2020   Vitamin D deficiency 11/11/2020   PCOS (polycystic ovarian syndrome) 09/29/2020   Keratosis pilaris 09/29/2020   Nexplanon in place 08/07/2019   Overweight (BMI 25.0-29.9) 04/03/2019   Cyst of left Bartholin's gland 04/03/2019   High risk medication use 04/03/2019   Screening for lipid disorders 06/26/2018   Gastroesophageal reflux disease without esophagitis 06/26/2018   Night terrors 06/26/2018   REFERRING DIAG: N94.10 (ICD-10-CM) - Dyspareunia in female   THERAPY DIAG:  Other muscle spasm   Stiffness of right hip, not elsewhere classified   Muscle weakness (generalized)   Rationale for Evaluation and Treatment Rehabilitation   ONSET DATE: 01/2022   SUBJECTIVE:                                                                                                                                                                                             SUBJECTIVE STATEMENT: I have done the stretches.    PAIN:  Are you having pain? Yes see intercourse section   PRECAUTIONS: None   WEIGHT BEARING RESTRICTIONS: No   FALLS:  Has patient fallen in last 6 months? 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    PERTINENT HISTORY:  PDOS;    BOWEL MOVEMENT: No issues     URINATION: Pain with urination: No Fully empty bladder: Yes: feels like she is but test show she is not.  Stream: Strong Urgency: No Frequency: good Leakage:  just when she has to really have to go and traveling Pads: No   INTERCOURSE: Pain with intercourse: Initial Penetration, During Penetration, and feels the pain at level 7/10 in the suprapubic area and deeper penetration is worse.  Ability to have vaginal penetration:  Yes:   Climax: yes Marinoff Scale: 1/3   PREGNANCY: Vaginal deliveries 2 Tearing No   OBJECTIVE:    DIAGNOSTIC FINDINGS:  Ultrasound showed her bladder half full after she urinated   COGNITION: Overall cognitive status: Within functional limits for tasks assessed                          SENSATION: Light touch: Appears intact Proprioception: Appears intact       LUMBAR SPECIAL TESTS:  SI Compression/distraction test: Negative and FABER test: Negative     POSTURE: No Significant postural limitations   PELVIC ALIGNMENT: ASIS are equal   LUMBARAROM/PROM:Lumbar ROM is full   LOWER EXTREMITY ROM:   Passive ROM Right eval Left eval  Hip flexion 120 125  Hip internal rotation 20 27  Hip external rotation 60 80   (Blank rows = not tested)   LOWER EXTREMITY MMT:   MMT Right eval Left eval  Hip extension 4/5 5/5  Hip abduction 4-/5 4/5   Hip adduction 4/5 4/5    PALPATION:   General  right gluteus medius, right piriformis, right posterior hip, right quadratus                 External Perineal Exam intact and good coloring                             Internal Pelvic Floor bilateral iliococcygeus, obturator internist, right side of the bladder has restrictions   Patient confirms identification and approves PT to assess internal pelvic floor and treatment Yes   PELVIC MMT:   MMT eval 07/26/2022  Vaginal 2/5 weak hug 3/5 with good hug of therapist finger  (Blank rows = not tested)         TONE: Increased tone with right more than left   PROLAPSE: none   TODAY'S TREATMENT:  07/26/2022 Manual: Soft tissue mobilization: to assess for dry needling Manual work to the right gluteals, posterior right hip and TFL in left sidely to elongate after dry needling Manual work in the gluteal fold working on the pelvic floor muscles Internal pelvic floor techniques:No emotional/communication barriers or cognitive limitation. Patient is motivated to learn. Patient understands and agrees with treatment goals and plan. PT explains patient will be examined in standing, sitting, and lying down to see how their muscles and joints work. When they are ready, they will be asked to remove their underwear so PT can examine their perineum. The patient is also given the option of providing their own chaperone as one is not provided in our facility. The patient also has the right and is explained the right to defer or refuse any part of the evaluation or treatment including the internal exam. With the patient's consent, PT will use  one gloved finger to gently assess the muscles of the pelvic floor, seeing how well it contracts and relaxes and if there is muscle symmetry. After, the patient will get dressed and PT and patient will discuss exam findings and plan of care. PT and patient discuss plan of care, schedule, attendance policy and HEP activities.   Going through the vagina working on the puborectalis, along the levator ani on the right One finger in the vaginal canal on the right side of the bladder and other hand on the lower abdomen working through the fascial restrictions :Trigger Point Dry-Needling  Treatment instructions: Expect mild to moderate muscle soreness. S/S of pneumothorax if dry needled over a lung field, and to seek immediate medical attention should they occur. Patient verbalized understanding of these instructions and education.  Patient Consent Given: Yes Education handout provided: Yes Muscles treated: right gluteal and hip external rotators, TFL Electrical stimulation performed: No Parameters: N/A Treatment response/outcome: elongation of muscles and trigger point response Used moist heat on the right gluteals after manual work to relax the muscles for 15 minutes Neuromuscular re-education: Pelvic floor contraction training:one finger in the vaginal canal working on contraction with lower abdominal contraction       PATIENT EDUCATION:  07/26/2022 Education details: Access Code: [email or web address], information on dry needling Person educated: Patient Education method: Explanation, Demonstration, Tactile cues, Verbal cues, and Handouts Education comprehension: verbalized understanding, returned demonstration, verbal cues required, tactile cues required, and needs further education   HOME EXERCISE PROGRAM: 07/26/2022 Access Code: [facility] URL: [email or web address] Date: 07/26/2022 Prepared by: Earlie Counts  Exercises - Pigeon Pose  - 1 x daily - 7 x weekly - 1 sets - 2 reps - 30 sec hold - Supine Piriformis Stretch with Leg Straight  - 1 x daily - 7 x weekly - 1 sets - 2 reps - 3 sec hold - Seated Pelvic Floor Contraction  - 3 x daily - 7 x weekly - 1 sets - 10 reps - 5 sec hold   ASSESSMENT:   CLINICAL IMPRESSION: Patient is a 36 y.o. female who was seen today for physical therapy  treatment for  Dyspareunia. Patient had trigger points in the right gluteals and posterior right hip. After manual work she was able to flex the right hip higher than the left in standing. Pelvic floor strength increased to 3/5 with good hug of therapist finger when the lower abdominals contract.  Patient will benefit from skilled therapy to reduce her pain in right hip and lower abdominal areas to improve quality of life.    OBJECTIVE IMPAIRMENTS: decreased coordination, decreased strength, increased fascial restrictions, increased muscle spasms, and pain.    ACTIVITY LIMITATIONS: sitting   PARTICIPATION LIMITATIONS: interpersonal relationship and driving   PERSONAL FACTORS: Fitness and 1 comorbidity: PCOS  are also affecting patient's functional outcome.    REHAB POTENTIAL: Excellent   CLINICAL DECISION MAKING: Stable/uncomplicated   EVALUATION COMPLEXITY: Low     GOALS: Goals reviewed with patient? Yes   SHORT TERM GOALS: Target date: 08/09/2022   Patient independent with initial HEP for hip stretches to improve tissue mobility.  Baseline: Goal status: Met 07/26/2022   2.  Patient reports pain with intercourse decreased >/= 25% due to reduced trigger points in the muscles.  Baseline:  Goal status: INITIAL   3.  Patient right hip pain decreased >/= 25%  due to improved flexion and external rotation Baseline:  Goal status: INITIAL     LONG TERM GOALS: Target date: 10/04/2022  Patient is independent with advanced HEP for pelvic floor strength and improved right hip strength . Baseline:  Goal status: INITIAL   2.  Patient right hip flexion increased >/= 125 and external rotation increased >/= 80 degrees to reduce the clicking in the right hip after sitting.  Baseline:  Goal status: INITIAL   3.  Patient able to have penile penetration vaginally with pain decreased </= 1/10 due to improved lengthening of the pelvic floor muscles.  Baseline:  Goal status: INITIAL   4.  Patient able to  fully empty her bladder due to reduced muscle tone of the pelvic floor.  Baseline:  Goal status: INITIAL     PLAN:   PT FREQUENCY: 1x/week   PT DURATION: 12 weeks   PLANNED INTERVENTIONS: Therapeutic exercises, Therapeutic activity, Neuromuscular re-education, Patient/Family education, Self Care, Joint mobilization, Dry Needling, Electrical stimulation, Cryotherapy, Moist heat, Taping, Biofeedback, and Manual therapy   PLAN FOR NEXT SESSION: dry needling to right hip, right hip mobilization, manual therapy to pelvic floor, abdominal exercise    Earlie Counts, PT 07/26/22 11:06 AM

## 2022-07-27 DIAGNOSIS — M62431 Contracture of muscle, right forearm: Secondary | ICD-10-CM | POA: Diagnosis not present

## 2022-07-27 DIAGNOSIS — M9907 Segmental and somatic dysfunction of upper extremity: Secondary | ICD-10-CM | POA: Diagnosis not present

## 2022-07-31 ENCOUNTER — Ambulatory Visit: Payer: BC Managed Care – PPO | Admitting: Physical Therapy

## 2022-07-31 ENCOUNTER — Encounter: Payer: Self-pay | Admitting: Physical Therapy

## 2022-07-31 DIAGNOSIS — M62838 Other muscle spasm: Secondary | ICD-10-CM

## 2022-07-31 DIAGNOSIS — M25651 Stiffness of right hip, not elsewhere classified: Secondary | ICD-10-CM

## 2022-07-31 DIAGNOSIS — M6281 Muscle weakness (generalized): Secondary | ICD-10-CM

## 2022-07-31 NOTE — Therapy (Signed)
OUTPATIENT PHYSICAL THERAPY TREATMENT NOTE   Patient Name: Nancy Jenkins MRN: [id number] DOB:09-12-1986, 36 y.o., female Today's Date: 07/31/2022  PCP: Carlena Hurl, PA-C   REFERRING PROVIDER:  Aletha Halim, MD    END OF SESSION:   PT End of Session - 07/31/22 1100     Visit Number 3    Date for PT Re-Evaluation 10/04/22    Authorization Type [employer]    Authorization - Visit Number 3    Authorization - Number of Visits 20    PT Start Time 1100    PT Stop Time 1140    PT Time Calculation (min) 40 min    Activity Tolerance Patient tolerated treatment well    Behavior During Therapy WFL for tasks assessed/performed             Past Medical History:  Diagnosis Date   GERD (gastroesophageal reflux disease)    Night terrors    Tension headache    Thyroid activity decreased    in past   Wears contact lenses    Past Surgical History:  Procedure Laterality Date   COLONOSCOPY  2010   along with EGD, Dr. Volanda Napoleon   ESOPHAGOGASTRODUODENOSCOPY  2010   biopsy, no h pylori, Dr. Volanda Napoleon   Patient Active Problem List   Diagnosis Date Noted   Abnormal thyroid blood test 10/03/2021   Encounter for health maintenance examination in adult 11/11/2020   Vitamin D deficiency 11/11/2020   PCOS (polycystic ovarian syndrome) 09/29/2020   Keratosis pilaris 09/29/2020   Nexplanon in place 08/07/2019   Overweight (BMI 25.0-29.9) 04/03/2019   Cyst of left Bartholin's gland 04/03/2019   High risk medication use 04/03/2019   Screening for lipid disorders 06/26/2018   Gastroesophageal reflux disease without esophagitis 06/26/2018   Night terrors 06/26/2018   REFERRING DIAG: N94.10 (ICD-10-CM) - Dyspareunia in female   THERAPY DIAG:  Other muscle spasm   Stiffness of right hip, not elsewhere classified   Muscle weakness (generalized)   Rationale for Evaluation and Treatment Rehabilitation   ONSET DATE: 01/2022   SUBJECTIVE:                                                                                                                                                                                             SUBJECTIVE STATEMENT: After dry needling. My hip popped 2 times. I have soreness behind the right hip after the dry needling. I walked the 5 k.    PAIN:  Are you having pain? Yes see intercourse section   PRECAUTIONS: None   WEIGHT BEARING RESTRICTIONS: No   FALLS:  Has patient fallen in  last 6 months? 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    PERTINENT HISTORY:  PDOS;    BOWEL MOVEMENT: No issues     URINATION: Pain with urination: No Fully empty bladder: Yes: feels like she is but test show she is not.  Stream: Strong Urgency: No Frequency: good Leakage:  just when she has to really have to go and traveling Pads: No   INTERCOURSE: Pain with intercourse: Initial Penetration, During Penetration, and feels the pain at level 7/10 in the suprapubic area and deeper penetration is worse.  Ability to have vaginal penetration:  Yes:   Climax: yes Marinoff Scale: 1/3   PREGNANCY: Vaginal deliveries 2 Tearing No     OBJECTIVE: (objective measures completed at initial evaluation unless otherwise dated)   (Copy Eval's Objective through Plan section here)   DIAGNOSTIC FINDINGS:  Ultrasound showed her bladder half full after she urinated   COGNITION: Overall cognitive status: Within functional limits for tasks assessed                          SENSATION: Light touch: Appears intact Proprioception: Appears intact       LUMBAR SPECIAL TESTS:  SI Compression/distraction test: Negative and FABER test: Negative     POSTURE: No Significant postural limitations   PELVIC ALIGNMENT: ASIS are equal   LUMBARAROM/PROM:Lumbar ROM is full   LOWER EXTREMITY ROM:   Passive ROM  Right eval Left eval Right  07/31/2022  Hip flexion 120 125 125  Hip internal rotation _0 Hip external rotation 60 80 70   (Blank rows = not tested)   LOWER EXTREMITY MMT:   MMT Right eval Left eval  Hip extension 4/5 5/5  Hip abduction 4-/5 4/5  Hip adduction 4/5 4/5    PALPATION:   General  right gluteus medius, right piriformis, right posterior hip, right quadratus                 External Perineal Exam intact and good coloring                             Internal Pelvic Floor bilateral iliococcygeus, obturator internist, right side of the bladder has restrictions   Patient confirms identification and approves PT to assess internal pelvic floor and treatment Yes   PELVIC MMT:   MMT eval 07/26/2022  Vaginal 2/5 weak hug 3/5 with good hug of therapist finger  (Blank rows = not tested)         TONE: Increased tone with right more than left   PROLAPSE: none   TODAY'S TREATMENT:  07/31/2022 Manual: Soft tissue mobilization:manual work to the posterior right hip, along the gluteals, along the TFL and hip flexor Myofascial release:using the suction cup on the right gluteal and posterior right hip Internal pelvic floor techniques:No emotional/communication barriers or cognitive limitation. Patient is motivated to learn. Patient understands and agrees with treatment goals and plan. PT explains patient will be examined in standing, sitting, and lying down to see how their muscles and joints work. When they are ready, they will be asked to remove their underwear so PT can examine their perineum. The patient is also given the option of providing their own chaperone as one is  not provided in our facility. The patient also has the right and is explained the right to defer or refuse any part of the evaluation or treatment including the internal exam. With the patient's consent, PT will use one gloved finger to gently assess the muscles of the pelvic floor, seeing how well it  contracts and relaxes and if there is muscle symmetry. After, the patient will get dressed and PT and patient will discuss exam findings and plan of care. PT and patient discuss plan of care, schedule, attendance policy and HEP activities.  Going through the vaginal working on the obturator internist, along the levator ani and right side of bladder in left sidlying Exercises: Stretches/mobility: Strengthening:stand on one leg and other leg has a towel under the foot and push outward 10x each side Standing on one leg and other foot on ball and do A-Z bil.     07/26/2022 Manual: Soft tissue mobilization: to assess for dry needling Manual work to the right gluteals, posterior right hip and TFL in left sidely to elongate after dry needling Manual work in the gluteal fold working on the pelvic floor muscles Internal pelvic floor techniques:No emotional/communication barriers or cognitive limitation. Patient is motivated to learn. Patient understands and agrees with treatment goals and plan. PT explains patient will be examined in standing, sitting, and lying down to see how their muscles and joints work. When they are ready, they will be asked to remove their underwear so PT can examine their perineum. The patient is also given the option of providing their own chaperone as one is not provided in our facility. The patient also has the right and is explained the right to defer or refuse any part of the evaluation or treatment including the internal exam. With the patient's consent, PT will use one gloved finger to gently assess the muscles of the pelvic floor, seeing how well it contracts and relaxes and if there is muscle symmetry. After, the patient will get dressed and PT and patient will discuss exam findings and plan of care. PT and patient discuss plan of care, schedule, attendance policy and HEP activities.  Going through the vagina working on the puborectalis, along the levator ani on the right One  finger in the vaginal canal on the right side of the bladder and other hand on the lower abdomen working through the fascial restrictions :Trigger Point Dry-Needling  Treatment instructions: Expect mild to moderate muscle soreness. S/S of pneumothorax if dry needled over a lung field, and to seek immediate medical attention should they occur. Patient verbalized understanding of these instructions and education.   Patient Consent Given: Yes Education handout provided: Yes Muscles treated: right gluteal and hip external rotators, TFL Electrical stimulation performed: No Parameters: N/A Treatment response/outcome: elongation of muscles and trigger point response Used moist heat on the right gluteals after manual work to relax the muscles for 15 minutes Neuromuscular re-education: Pelvic floor contraction training:one finger in the vaginal canal working on contraction with lower abdominal contraction       PATIENT EDUCATION:  07/26/2022 Education details: Access Code: [email or web address], information on dry needling Person educated: Patient Education method: Explanation, Demonstration, Tactile cues, Verbal cues, and Handouts Education comprehension: verbalized understanding, returned demonstration, verbal cues required, tactile cues required, and needs further education   HOME EXERCISE PROGRAM: 07/26/2022 Access Code: [facility] URL: [email or web address] Date: 07/26/2022 Prepared by: Earlie Counts   Exercises - Pigeon Pose  - 1 x daily - 7 x weekly - 1 sets -  2 reps - 30 sec hold - Supine Piriformis Stretch with Leg Straight  - 1 x daily - 7 x weekly - 1 sets - 2 reps - 3 sec hold - Seated Pelvic Floor Contraction  - 3 x daily - 7 x weekly - 1 sets - 10 reps - 5 sec hold   ASSESSMENT:   CLINICAL IMPRESSION: Patient is a 36 y.o. female who was seen today for physical therapy  treatment for Dyspareunia. Patient has increased motion in right hip. Pain with penetration over the bladder and  not as often.  Patient has tightness in the posterior right hip and along the right pelvic floor muscles and right side of bladder.  Patient Patient will benefit from skilled therapy to reduce her pain in right hip and lower abdominal areas to improve quality of life.    OBJECTIVE IMPAIRMENTS: decreased coordination, decreased strength, increased fascial restrictions, increased muscle spasms, and pain.    ACTIVITY LIMITATIONS: sitting   PARTICIPATION LIMITATIONS: interpersonal relationship and driving   PERSONAL FACTORS: Fitness and 1 comorbidity: PCOS  are also affecting patient's functional outcome.    REHAB POTENTIAL: Excellent   CLINICAL DECISION MAKING: Stable/uncomplicated   EVALUATION COMPLEXITY: Low     GOALS: Goals reviewed with patient? Yes   SHORT TERM GOALS: Target date: 08/09/2022   Patient independent with initial HEP for hip stretches to improve tissue mobility.  Baseline: Goal status: Met 07/26/2022   2.  Patient reports pain with intercourse decreased >/= 25% due to reduced trigger points in the muscles.  Baseline:  Goal status: Met 07/31/2022   3.  Patient right hip pain decreased >/= 25%  due to improved flexion and external rotation Baseline:  Goal status: INITIAL     LONG TERM GOALS: Target date: 10/04/2022    Patient is independent with advanced HEP for pelvic floor strength and improved right hip strength . Baseline:  Goal status: INITIAL   2.  Patient right hip flexion increased >/= 125 and external rotation increased >/= 80 degrees to reduce the clicking in the right hip after sitting.  Baseline:  Goal status: INITIAL   3.  Patient able to have penile penetration vaginally with pain decreased </= 1/10 due to improved lengthening of the pelvic floor muscles.  Baseline:  Goal status: INITIAL   4.  Patient able to fully empty her bladder due to reduced muscle tone of the pelvic floor.  Baseline:  Goal status: INITIAL     PLAN:   PT  FREQUENCY: 1x/week   PT DURATION: 12 weeks   PLANNED INTERVENTIONS: Therapeutic exercises, Therapeutic activity, Neuromuscular re-education, Patient/Family education, Self Care, Joint mobilization, Dry Needling, Electrical stimulation, Cryotherapy, Moist heat, Taping, Biofeedback, and Manual therapy   PLAN FOR NEXT SESSION: manual therapy to pelvic floor and post. Right hip abdominal exercise , right hip closed chain exercises.  Earlie Counts, PT 07/31/22 11:58 AM

## 2022-08-18 ENCOUNTER — Encounter: Payer: Self-pay | Admitting: Physical Therapy

## 2022-08-18 ENCOUNTER — Ambulatory Visit: Payer: BC Managed Care – PPO | Attending: Obstetrics and Gynecology | Admitting: Physical Therapy

## 2022-08-18 DIAGNOSIS — M6281 Muscle weakness (generalized): Secondary | ICD-10-CM | POA: Insufficient documentation

## 2022-08-18 DIAGNOSIS — M62838 Other muscle spasm: Secondary | ICD-10-CM | POA: Insufficient documentation

## 2022-08-18 DIAGNOSIS — M25651 Stiffness of right hip, not elsewhere classified: Secondary | ICD-10-CM | POA: Insufficient documentation

## 2022-08-18 NOTE — Therapy (Signed)
OUTPATIENT PHYSICAL THERAPY TREATMENT NOTE   Patient Name: Nancy Jenkins MRN: [id number] DOB:09/29/1985, 36 y.o., female Today's Date: 08/18/2022  PCP: Carlena Hurl, PA-C   REFERRING PROVIDER: Aletha Halim, MD   END OF SESSION:   PT End of Session - 08/18/22 1015     Visit Number 4    Date for PT Re-Evaluation 10/04/22    Authorization Type [employer]    Authorization - Visit Number 4    Authorization - Number of Visits 20    PT Start Time 6546    PT Stop Time 1055    PT Time Calculation (min) 40 min    Activity Tolerance Patient tolerated treatment well    Behavior During Therapy WFL for tasks assessed/performed             Past Medical History:  Diagnosis Date   GERD (gastroesophageal reflux disease)    Night terrors    Tension headache    Thyroid activity decreased    in past   Wears contact lenses    Past Surgical History:  Procedure Laterality Date   COLONOSCOPY  2010   along with EGD, Dr. Volanda Napoleon   ESOPHAGOGASTRODUODENOSCOPY  2010   biopsy, no h pylori, Dr. Volanda Napoleon   Patient Active Problem List   Diagnosis Date Noted   Abnormal thyroid blood test 10/03/2021   Encounter for health maintenance examination in adult 11/11/2020   Vitamin D deficiency 11/11/2020   PCOS (polycystic ovarian syndrome) 09/29/2020   Keratosis pilaris 09/29/2020   Nexplanon in place 08/07/2019   Overweight (BMI 25.0-29.9) 04/03/2019   Cyst of left Bartholin's gland 04/03/2019   High risk medication use 04/03/2019   Screening for lipid disorders 06/26/2018   Gastroesophageal reflux disease without esophagitis 06/26/2018   Night terrors 06/26/2018   REFERRING DIAG: N94.10 (ICD-10-CM) - Dyspareunia in female   THERAPY DIAG:  Other muscle spasm   Stiffness of right hip, not elsewhere classified   Muscle weakness (generalized)   Rationale for Evaluation and Treatment Rehabilitation   ONSET DATE: 01/2022   SUBJECTIVE:                                                                                                                                                                                             SUBJECTIVE STATEMENT: My right hip is moving well. Pain with intercourse is 85% better.    PAIN:  Are you having pain? Yes see intercourse section   PRECAUTIONS: None   WEIGHT BEARING RESTRICTIONS: No   FALLS:  Has patient fallen in last 6 months? 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    PERTINENT HISTORY:  PDOS;    BOWEL MOVEMENT: No issues     URINATION: Pain with urination: No Fully empty bladder: Yes: feels like she is but test show she is not.  Stream: Strong Urgency: No Frequency: good Leakage:  just when she has to really have to go and traveling Pads: No   INTERCOURSE: Pain with intercourse: Initial Penetration, During Penetration, and feels the pain at level 7/10 in the suprapubic area and deeper penetration is worse.  Ability to have vaginal penetration:  Yes:   Climax: yes Marinoff Scale: 1/3   PREGNANCY: Vaginal deliveries 2 Tearing No     OBJECTIVE: (objective measures completed at initial evaluation unless otherwise dated)     (Copy Eval's Objective through Plan section here)   DIAGNOSTIC FINDINGS:  Ultrasound showed her bladder half full after she urinated   COGNITION: Overall cognitive status: Within functional limits for tasks assessed                          SENSATION: Light touch: Appears intact Proprioception: Appears intact       LUMBAR SPECIAL TESTS:  SI Compression/distraction test: Negative and FABER test: Negative     POSTURE: No Significant postural limitations   PELVIC ALIGNMENT: ASIS are equal   LUMBARAROM/PROM:Lumbar ROM is full   LOWER EXTREMITY ROM:   Passive ROM Right eval Left eval Right  07/31/2022  Hip flexion 120 125  125  Hip internal rotation _0 Hip external rotation 60 80 70   (Blank rows = not tested)   LOWER EXTREMITY MMT:   MMT Right eval Left eval  Hip extension 4/5 5/5  Hip abduction 4-/5 4/5  Hip adduction 4/5 4/5    PALPATION:   General  right gluteus medius, right piriformis, right posterior hip, right quadratus                 External Perineal Exam intact and good coloring                             Internal Pelvic Floor bilateral iliococcygeus, obturator internist, right side of the bladder has restrictions   Patient confirms identification and approves PT to assess internal pelvic floor and treatment Yes   PELVIC MMT:   MMT eval 07/26/2022 08/18/2022  Vaginal 2/5 weak hug 3/5 with good hug of therapist finger 3/5  (Blank rows = not tested)         TONE: Increased tone with right more than left   PROLAPSE: none   TODAY'S TREATMENT:  08/18/2022 Manual: Soft tissue mobilization: To assess for dry needling Manual work to the right gluteus medius, gluteus maximus, along the posterior right hip and TFL to elongate after dry needling Internal pelvic floor techniques:No emotional/communication barriers or cognitive limitation. Patient is motivated to learn. Patient understands and agrees with treatment goals and plan. PT explains patient will be examined in standing, sitting, and lying down to see how their muscles and joints work. When they are ready, they will be asked to remove their underwear so PT can examine their perineum. The patient is also given the option of providing their own chaperone as one is not provided in our facility. The patient also has the right and is explained the right  to defer or refuse any part of the evaluation or treatment including the internal exam. With the patient's consent, PT will use one gloved finger to gently assess the muscles of the pelvic floor, seeing how well it contracts and relaxes and if there is muscle symmetry. After, the  patient will get dressed and PT and patient will discuss exam findings and plan of care. PT and patient discuss plan of care, schedule, attendance policy and HEP activities.  Going through the vagina and other hand on the lower abdomen to fascially release along the left side of the bladder and above the bladder Trigger Point Dry-Needling  Treatment instructions: Expect mild to moderate muscle soreness. S/S of pneumothorax if dry needled over a lung field, and to seek immediate medical attention should they occur. Patient verbalized understanding of these instructions and education.  Patient Consent Given: Yes Education handout provided: Previously provided Muscles treated: right gluteus medius, TFL, gluteus maximus Electrical stimulation performed: No Parameters: N/A Treatment response/outcome: elongation of muscle, trigger response   Neuromuscular re-education: Pelvic floor contraction training: with therapist index finger in the vaginal canal using a quick stretch and tapping method to the sides of the introitus to work on a circular contraction Exercises: Stretches/mobility: Quadruped with black theraband around the right hip doing rocking and side to side motion to mobilization the hip and educate on how to perform at home.  Right foot on the 8 inch step with her flexing and twisting trunk to the right to stretch the posterior right hip    07/31/2022 Manual: Soft tissue mobilization:manual work to the posterior right hip, along the gluteals, along the TFL and hip flexor Myofascial release:using the suction cup on the right gluteal and posterior right hip Internal pelvic floor techniques:No emotional/communication barriers or cognitive limitation. Patient is motivated to learn. Patient understands and agrees with treatment goals and plan. PT explains patient will be examined in standing, sitting, and lying down to see how their muscles and joints work. When they are ready, they will be  asked to remove their underwear so PT can examine their perineum. The patient is also given the option of providing their own chaperone as one is not provided in our facility. The patient also has the right and is explained the right to defer or refuse any part of the evaluation or treatment including the internal exam. With the patient's consent, PT will use one gloved finger to gently assess the muscles of the pelvic floor, seeing how well it contracts and relaxes and if there is muscle symmetry. After, the patient will get dressed and PT and patient will discuss exam findings and plan of care. PT and patient discuss plan of care, schedule, attendance policy and HEP activities.  Going through the vaginal working on the obturator internist, along the levator ani and right side of bladder in left sidlying Exercises: Stretches/mobility: Strengthening:stand on one leg and other leg has a towel under the foot and push outward 10x each side Standing on one leg and other foot on ball and do A-Z bil.     07/26/2022 Manual: Soft tissue mobilization: to assess for dry needling Manual work to the right gluteals, posterior right hip and TFL in left sidely to elongate after dry needling Manual work in the gluteal fold working on the pelvic floor muscles Internal pelvic floor techniques:No emotional/communication barriers or cognitive limitation. Patient is motivated to learn. Patient understands and agrees with treatment goals and plan. PT explains patient will be examined in  standing, sitting, and lying down to see how their muscles and joints work. When they are ready, they will be asked to remove their underwear so PT can examine their perineum. The patient is also given the option of providing their own chaperone as one is not provided in our facility. The patient also has the right and is explained the right to defer or refuse any part of the evaluation or treatment including the internal exam. With the  patient's consent, PT will use one gloved finger to gently assess the muscles of the pelvic floor, seeing how well it contracts and relaxes and if there is muscle symmetry. After, the patient will get dressed and PT and patient will discuss exam findings and plan of care. PT and patient discuss plan of care, schedule, attendance policy and HEP activities.  Going through the vagina working on the puborectalis, along the levator ani on the right One finger in the vaginal canal on the right side of the bladder and other hand on the lower abdomen working through the fascial restrictions :Trigger Point Dry-Needling  Treatment instructions: Expect mild to moderate muscle soreness. S/S of pneumothorax if dry needled over a lung field, and to seek immediate medical attention should they occur. Patient verbalized understanding of these instructions and education.   Patient Consent Given: Yes Education handout provided: Yes Muscles treated: right gluteal and hip external rotators, TFL Electrical stimulation performed: No Parameters: N/A Treatment response/outcome: elongation of muscles and trigger point response Used moist heat on the right gluteals after manual work to relax the muscles for 15 minutes Neuromuscular re-education: Pelvic floor contraction training:one finger in the vaginal canal working on contraction with lower abdominal contraction       PATIENT EDUCATION:  07/26/2022 Education details: Access Code: [email or web address], information on dry needling Person educated: Patient Education method: Explanation, Demonstration, Tactile cues, Verbal cues, and Handouts Education comprehension: verbalized understanding, returned demonstration, verbal cues required, tactile cues required, and needs further education   HOME EXERCISE PROGRAM: 07/26/2022 Access Code: [facility] URL: [email or web address] Date: 07/26/2022 Prepared by: Earlie Counts   Exercises - Pigeon Pose  - 1 x daily - 7 x  weekly - 1 sets - 2 reps - 30 sec hold - Supine Piriformis Stretch with Leg Straight  - 1 x daily - 7 x weekly - 1 sets - 2 reps - 3 sec hold - Seated Pelvic Floor Contraction  - 3 x daily - 7 x weekly - 1 sets - 10 reps - 5 sec hold   ASSESSMENT:   CLINICAL IMPRESSION: Patient is a 36 y.o. female who was seen today for physical therapy  treatment for Dyspareunia. Pain with penile penetration vaginally has decreased by 80%. She still gets the pain superior bladder area. She continues to have some tightness in the posterior right hip. She had trigger points in the gluteus medius and maximus. Pelvic floor strength is 3/5.  Patient will benefit from skilled therapy to reduce her pain in right hip and lower abdominal areas to improve quality of life.    OBJECTIVE IMPAIRMENTS: decreased coordination, decreased strength, increased fascial restrictions, increased muscle spasms, and pain.    ACTIVITY LIMITATIONS: sitting   PARTICIPATION LIMITATIONS: interpersonal relationship and driving   PERSONAL FACTORS: Fitness and 1 comorbidity: PCOS  are also affecting patient's functional outcome.    REHAB POTENTIAL: Excellent   CLINICAL DECISION MAKING: Stable/uncomplicated   EVALUATION COMPLEXITY: Low     GOALS: Goals reviewed with patient? Yes   SHORT TERM GOALS:  Target date: 08/09/2022   Patient independent with initial HEP for hip stretches to improve tissue mobility.  Baseline: Goal status: Met 07/26/2022   2.  Patient reports pain with intercourse decreased >/= 25% due to reduced trigger points in the muscles.  Baseline:  Goal status: Met 07/31/2022   3.  Patient right hip pain decreased >/= 25%  due to improved flexion and external rotation Baseline:  Goal status: Met 08/18/2022     LONG TERM GOALS: Target date: 10/04/2022    Patient is independent with advanced HEP for pelvic floor strength and improved right hip strength . Baseline:  Goal status: INITIAL   2.  Patient right hip  flexion increased >/= 125 and external rotation increased >/= 80 degrees to reduce the clicking in the right hip after sitting.  Baseline:  Goal status: INITIAL   3.  Patient able to have penile penetration vaginally with pain decreased </= 1/10 due to improved lengthening of the pelvic floor muscles.  Baseline:  Goal status: INITIAL   4.  Patient able to fully empty her bladder due to reduced muscle tone of the pelvic floor.  Baseline:  Goal status: INITIAL     PLAN:   PT FREQUENCY: 1x/week   PT DURATION: 12 weeks   PLANNED INTERVENTIONS: Therapeutic exercises, Therapeutic activity, Neuromuscular re-education, Patient/Family education, Self Care, Joint mobilization, Dry Needling, Electrical stimulation, Cryotherapy, Moist heat, Taping, Biofeedback, and Manual therapy   PLAN FOR NEXT SESSION: manual therapy to pelvic floor and post. Right hip abdominal exercise , right hip closed chain exercises, see if empty bladder fully  Earlie Counts, PT 08/18/22 11:02 AM

## 2022-08-23 ENCOUNTER — Ambulatory Visit: Payer: BC Managed Care – PPO | Admitting: Physical Therapy

## 2022-08-28 ENCOUNTER — Encounter: Payer: Self-pay | Admitting: Physical Therapy

## 2022-08-28 ENCOUNTER — Ambulatory Visit: Payer: BC Managed Care – PPO | Admitting: Physical Therapy

## 2022-08-28 DIAGNOSIS — M62838 Other muscle spasm: Secondary | ICD-10-CM | POA: Diagnosis not present

## 2022-08-28 DIAGNOSIS — M6281 Muscle weakness (generalized): Secondary | ICD-10-CM | POA: Diagnosis not present

## 2022-08-28 DIAGNOSIS — M25651 Stiffness of right hip, not elsewhere classified: Secondary | ICD-10-CM | POA: Diagnosis not present

## 2022-08-28 NOTE — Therapy (Addendum)
OUTPATIENT PHYSICAL THERAPY TREATMENT NOTE   Patient Name: Nancy Jenkins MRN: [id number] DOB:1986-01-11, 36 y.o., female Today's Date: 08/28/2022  PCP: Carlena Hurl, PA-C  REFERRING PROVIDER: Aletha Halim, MD    END OF SESSION:   PT End of Session - 08/28/22 0933     Visit Number 5    Date for PT Re-Evaluation 10/04/22    Authorization Type BCBS    Authorization - Visit Number 5    Authorization - Number of Visits 20    PT Start Time 0930    PT Stop Time 1010    PT Time Calculation (min) 40 min    Activity Tolerance Patient tolerated treatment well    Behavior During Therapy WFL for tasks assessed/performed             Past Medical History:  Diagnosis Date   GERD (gastroesophageal reflux disease)    Night terrors    Tension headache    Thyroid activity decreased    in past   Wears contact lenses    Past Surgical History:  Procedure Laterality Date   COLONOSCOPY  2010   along with EGD, Dr. Volanda Napoleon   ESOPHAGOGASTRODUODENOSCOPY  2010   biopsy, no h pylori, Dr. Volanda Napoleon   Patient Active Problem List   Diagnosis Date Noted   Abnormal thyroid blood test 10/03/2021   Encounter for health maintenance examination in adult 11/11/2020   Vitamin D deficiency 11/11/2020   PCOS (polycystic ovarian syndrome) 09/29/2020   Keratosis pilaris 09/29/2020   Nexplanon in place 08/07/2019   Overweight (BMI 25.0-29.9) 04/03/2019   Cyst of left Bartholin's gland 04/03/2019   High risk medication use 04/03/2019   Screening for lipid disorders 06/26/2018   Gastroesophageal reflux disease without esophagitis 06/26/2018   Night terrors 06/26/2018   REFERRING DIAG: N94.10 (ICD-10-CM) - Dyspareunia in female   THERAPY DIAG:  Other muscle spasm   Stiffness of right hip, not elsewhere classified   Muscle weakness (generalized)   Rationale for Evaluation and Treatment Rehabilitation   ONSET DATE: 01/2022   SUBJECTIVE:                                                                                                                                                                                             SUBJECTIVE STATEMENT: I do not have the pain by the bladder with intercourse but in the anterior right groin. No issues with urinary leakage.    PAIN:  Are you having pain? Yes see intercourse section   PRECAUTIONS: None   WEIGHT BEARING RESTRICTIONS: No   FALLS:  Has patient fallen in last 6 months? 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    PERTINENT HISTORY:  PDOS;    BOWEL MOVEMENT: No issues     URINATION: Pain with urination: No Fully empty bladder: Yes: feels like she is but test show she is not.  Stream: Strong Urgency: No Frequency: good Leakage:  just when she has to really have to go and traveling Pads: No   INTERCOURSE: Pain with intercourse: Initial Penetration, During Penetration, and feels the pain at level 7/10 in the suprapubic area and deeper penetration is worse.  Ability to have vaginal penetration:  Yes:   Climax: yes Marinoff Scale: 1/3   PREGNANCY: Vaginal deliveries 2 Tearing No     OBJECTIVE: (objective measures completed at initial evaluation unless otherwise dated)      DIAGNOSTIC FINDINGS:  Ultrasound showed her bladder half full after she urinated   COGNITION: Overall cognitive status: Within functional limits for tasks assessed                          SENSATION: Light touch: Appears intact Proprioception: Appears intact       LUMBAR SPECIAL TESTS:  SI Compression/distraction test: Negative and FABER test: Negative     POSTURE: No Significant postural limitations   PELVIC ALIGNMENT: ASIS are equal   LUMBARAROM/PROM:Lumbar ROM is full   LOWER EXTREMITY ROM:   Passive ROM Right eval Left eval Right  07/31/2022  Hip flexion 120 125  125  Hip internal rotation _0 Hip external rotation 60 80 70   (Blank rows = not tested)   LOWER EXTREMITY MMT:   MMT Right eval Left eval  Hip extension 4/5 5/5  Hip abduction 4-/5 4/5  Hip adduction 4/5 4/5    PALPATION:   General  right gluteus medius, right piriformis, right posterior hip, right quadratus                 External Perineal Exam intact and good coloring                             Internal Pelvic Floor bilateral iliococcygeus, obturator internist, right side of the bladder has restrictions   Patient confirms identification and approves PT to assess internal pelvic floor and treatment Yes   PELVIC MMT:   MMT eval 07/26/2022 08/18/2022 08/28/2022  Vaginal 2/5 weak hug 3/5 with good hug of therapist finger 3/5 4/5  (Blank rows = not tested)         TONE: Increased tone with right more than left   PROLAPSE: none   TODAY'S TREATMENT:  08/28/2022 Manual: Myofascial release: Release of the right obturator internist with one finger on the obturator foremen and along the right suprapubic area.  Internal pelvic floor techniques:.No emotional/communication barriers or cognitive limitation. Patient is motivated to learn. Patient understands and agrees with treatment goals and plan. PT explains patient will be examined in standing, sitting, and lying down to see how their muscles and joints work. When they are ready, they will be asked to remove their underwear so PT can examine their perineum. The patient is also given the option of providing their own chaperone as one is not provided in our facility. The patient also has the right and is explained the right to  defer or refuse any part of the evaluation or treatment including the internal exam. With the patient's consent, PT will use one gloved finger to gently assess the muscles of the pelvic floor, seeing how well it contracts and relaxes and if there is muscle symmetry. After, the patient will get dressed and  PT and patient will discuss exam findings and plan of care. PT and patient discuss plan of care, schedule, attendance policy and HEP activities.  Going through the vaginal canal working on the right obturator internist and along the ischial spine to clear trigger points.  Exercises: Stretches/mobility: Quadruped with hips internally rotated and rocking back and forth to stretch the obturator internist.  Strengthening: Nustep for 6 minutes level 3 while assessing patient    08/18/2022 Manual: Soft tissue mobilization: To assess for dry needling Manual work to the right gluteus medius, gluteus maximus, along the posterior right hip and TFL to elongate after dry needling Internal pelvic floor techniques:No emotional/communication barriers or cognitive limitation. Patient is motivated to learn. Patient understands and agrees with treatment goals and plan. PT explains patient will be examined in standing, sitting, and lying down to see how their muscles and joints work. When they are ready, they will be asked to remove their underwear so PT can examine their perineum. The patient is also given the option of providing their own chaperone as one is not provided in our facility. The patient also has the right and is explained the right to defer or refuse any part of the evaluation or treatment including the internal exam. With the patient's consent, PT will use one gloved finger to gently assess the muscles of the pelvic floor, seeing how well it contracts and relaxes and if there is muscle symmetry. After, the patient will get dressed and PT and patient will discuss exam findings and plan of care. PT and patient discuss plan of care, schedule, attendance policy and HEP activities.  Going through the vagina and other hand on the lower abdomen to fascially release along the left side of the bladder and above the bladder Trigger Point Dry-Needling  Treatment instructions: Expect mild to moderate muscle  soreness. S/S of pneumothorax if dry needled over a lung field, and to seek immediate medical attention should they occur. Patient verbalized understanding of these instructions and education.   Patient Consent Given: Yes Education handout provided: Previously provided Muscles treated: right gluteus medius, TFL, gluteus maximus Electrical stimulation performed: No Parameters: N/A Treatment response/outcome: elongation of muscle, trigger response    Neuromuscular re-education: Pelvic floor contraction training: with therapist index finger in the vaginal canal using a quick stretch and tapping method to the sides of the introitus to work on a circular contraction Exercises: Stretches/mobility: Quadruped with black theraband around the right hip doing rocking and side to side motion to mobilization the hip and educate on how to perform at home.  Right foot on the 8 inch step with her flexing and twisting trunk to the right to stretch the posterior right hip     07/31/2022 Manual: Soft tissue mobilization:manual work to the posterior right hip, along the gluteals, along the TFL and hip flexor Myofascial release:using the suction cup on the right gluteal and posterior right hip Internal pelvic floor techniques:No emotional/communication barriers or cognitive limitation. Patient is motivated to learn. Patient understands and agrees with treatment goals and plan. PT explains patient will be examined in standing, sitting, and lying down to see how their muscles and joints work. When they  are ready, they will be asked to remove their underwear so PT can examine their perineum. The patient is also given the option of providing their own chaperone as one is not provided in our facility. The patient also has the right and is explained the right to defer or refuse any part of the evaluation or treatment including the internal exam. With the patient's consent, PT will use one gloved finger to gently assess  the muscles of the pelvic floor, seeing how well it contracts and relaxes and if there is muscle symmetry. After, the patient will get dressed and PT and patient will discuss exam findings and plan of care. PT and patient discuss plan of care, schedule, attendance policy and HEP activities.  Going through the vaginal working on the obturator internist, along the levator ani and right side of bladder in left sidlying Exercises: Stretches/mobility: Strengthening:stand on one leg and other leg has a towel under the foot and push outward 10x each side Standing on one leg and other foot on ball and do A-Z bil.       PATIENT EDUCATION:  07/26/2022 Education details: Access Code: [email or web address], information on dry needling Person educated: Patient Education method: Explanation, Demonstration, Tactile cues, Verbal cues, and Handouts Education comprehension: verbalized understanding, returned demonstration, verbal cues required, tactile cues required, and needs further education   HOME EXERCISE PROGRAM: 07/26/2022 Access Code: [facility] URL: [email or web address] Date: 07/26/2022 Prepared by: Earlie Counts   Exercises - Pigeon Pose  - 1 x daily - 7 x weekly - 1 sets - 2 reps - 30 sec hold - Supine Piriformis Stretch with Leg Straight  - 1 x daily - 7 x weekly - 1 sets - 2 reps - 3 sec hold - Seated Pelvic Floor Contraction  - 3 x daily - 7 x weekly - 1 sets - 10 reps - 5 sec hold   ASSESSMENT:   CLINICAL IMPRESSION: Patient is a 36 y.o. female who was seen today for physical therapy  treatment for Dyspareunia.  She is not having pain by the bladder with intercourse but along the obturator internist. Patient is not leaking urine. Pelvic floor strength increased to  4/5.  Patient will benefit from skilled therapy to reduce her pain in right hip and lower abdominal areas to improve quality of life.    OBJECTIVE IMPAIRMENTS: decreased coordination, decreased strength, increased fascial  restrictions, increased muscle spasms, and pain.    ACTIVITY LIMITATIONS: sitting   PARTICIPATION LIMITATIONS: interpersonal relationship and driving   PERSONAL FACTORS: Fitness and 1 comorbidity: PCOS  are also affecting patient's functional outcome.    REHAB POTENTIAL: Excellent   CLINICAL DECISION MAKING: Stable/uncomplicated   EVALUATION COMPLEXITY: Low     GOALS: Goals reviewed with patient? Yes   SHORT TERM GOALS: Target date: 08/09/2022   Patient independent with initial HEP for hip stretches to improve tissue mobility.  Baseline: Goal status: Met 07/26/2022   2.  Patient reports pain with intercourse decreased >/= 25% due to reduced trigger points in the muscles.  Baseline:  Goal status: Met 07/31/2022   3.  Patient right hip pain decreased >/= 25%  due to improved flexion and external rotation Baseline:  Goal status: Met 08/18/2022     LONG TERM GOALS: Target date: 10/04/2022    Patient is independent with advanced HEP for pelvic floor strength and improved right hip strength . Baseline:  Goal status: INITIAL   2.  Patient right hip flexion increased >/= 125 and external rotation  increased >/= 80 degrees to reduce the clicking in the right hip after sitting.  Baseline:  Goal status: INITIAL   3.  Patient able to have penile penetration vaginally with pain decreased </= 1/10 due to improved lengthening of the pelvic floor muscles.  Baseline:  Goal status: INITIAL   4.  Patient able to fully empty her bladder due to reduced muscle tone of the pelvic floor.  Baseline:  Goal status: INITIAL     PLAN:   PT FREQUENCY: 1x/week   PT DURATION: 12 weeks   PLANNED INTERVENTIONS: Therapeutic exercises, Therapeutic activity, Neuromuscular re-education, Patient/Family education, Self Care, Joint mobilization, Dry Needling, Electrical stimulation, Cryotherapy, Moist heat, Taping, Biofeedback, and Manual therapy   PLAN FOR NEXT SESSION: manual therapy to pelvic floor  on the right . Right hip abdominal exercise , right hip closed chain exercises, see if empty bladder fully, possible discharge.    Earlie Counts, PT 08/28/22 10:07 AM    PHYSICAL THERAPY DISCHARGE SUMMARY  Visits from Start of Care: 5  Current functional level related to goals / functional outcomes: See above. Patient canceled her last visit due to [place]. She wanted to be discharged due to feeling good.    Remaining deficits: See above.    Education / Equipment: HEP   Patient agrees to discharge. Patient goals were partially met. Patient is being discharged due to being pleased with the current functional level. Thank you for the referral. Earlie Counts, PT 09/05/22 3:57 PM

## 2022-09-06 ENCOUNTER — Ambulatory Visit: Payer: BC Managed Care – PPO | Admitting: Physical Therapy

## 2022-10-02 ENCOUNTER — Encounter: Payer: Self-pay | Admitting: Internal Medicine

## 2022-10-06 ENCOUNTER — Other Ambulatory Visit: Payer: Self-pay | Admitting: Medical

## 2022-10-06 MED ORDER — CLONAZEPAM 0.5 MG PO TABS: 0.5000 mg | ORAL_TABLET | Freq: Every day | ORAL | 0 refills | Status: DC

## 2022-10-06 NOTE — Telephone Encounter (Signed)
From: Damita Lack To: Office of [place], [place] Sent: 10/03/2022 9:22 PM EST Subject: Medication Renewal Request  Refills have been requested for the following medications:   clonazePAM (KLONOPIN) 0.5 MG tablet [[name] Kimball Appleby]  Preferred pharmacy: [facility] Delivery method: Mail

## 2022-10-06 NOTE — Telephone Encounter (Signed)
Due for physical.  Please schedule fasting physical now

## 2022-10-10 DIAGNOSIS — K219 Gastro-esophageal reflux disease without esophagitis: Secondary | ICD-10-CM

## 2022-10-11 ENCOUNTER — Other Ambulatory Visit: Payer: Self-pay | Admitting: Medical

## 2022-10-11 MED ORDER — CLONAZEPAM 0.5 MG PO TABS: 0.5000 mg | ORAL_TABLET | Freq: Every day | ORAL | 0 refills | Status: DC

## 2022-10-11 MED ORDER — OMEPRAZOLE 20 MG PO CPDR: 20.0000 mg | DELAYED_RELEASE_CAPSULE | Freq: Every day | ORAL | 0 refills | Status: DC

## 2022-12-22 ENCOUNTER — Encounter: Payer: Self-pay | Admitting: Medical

## 2022-12-22 ENCOUNTER — Ambulatory Visit (INDEPENDENT_AMBULATORY_CARE_PROVIDER_SITE_OTHER): Payer: BC Managed Care – PPO | Admitting: Medical

## 2022-12-22 VITALS — BP 120/70 | HR 87 | Ht 65.5 in | Wt 195.4 lb

## 2022-12-22 DIAGNOSIS — E559 Vitamin D deficiency, unspecified: Secondary | ICD-10-CM | POA: Diagnosis not present

## 2022-12-22 DIAGNOSIS — K219 Gastro-esophageal reflux disease without esophagitis: Secondary | ICD-10-CM

## 2022-12-22 DIAGNOSIS — Z23 Encounter for immunization: Secondary | ICD-10-CM

## 2022-12-22 DIAGNOSIS — Z7185 Encounter for immunization safety counseling: Secondary | ICD-10-CM

## 2022-12-22 DIAGNOSIS — Z975 Presence of (intrauterine) contraceptive device: Secondary | ICD-10-CM | POA: Diagnosis not present

## 2022-12-22 DIAGNOSIS — Z13220 Encounter for screening for lipoid disorders: Secondary | ICD-10-CM

## 2022-12-22 DIAGNOSIS — Z0000 Encounter for general adult medical examination without abnormal findings: Secondary | ICD-10-CM

## 2022-12-22 DIAGNOSIS — F514 Sleep terrors [night terrors]: Secondary | ICD-10-CM

## 2022-12-22 LAB — CBC
Hematocrit: 40.2 % (ref 34.0–46.6)
Hemoglobin: 13.3 g/dL (ref 11.1–15.9)
MCH: 30.1 pg (ref 26.6–33.0)
MCHC: 33.1 g/dL (ref 31.5–35.7)
MCV: 91 fL (ref 79–97)
Platelets: 337 10*3/uL (ref 150–450)
RBC: 4.42 x10E6/uL (ref 3.77–5.28)
RDW: 12.3 % (ref 11.7–15.4)
WBC: 8.6 10*3/uL (ref 3.4–10.8)

## 2022-12-22 LAB — BASIC METABOLIC PANEL
BUN/Creatinine Ratio: 10 (ref 9–23)
BUN: 9 mg/dL (ref 6–20)
CO2: 22 mmol/L (ref 20–29)
Calcium: 9.4 mg/dL (ref 8.7–10.2)
Chloride: 99 mmol/L (ref 96–106)
Creatinine, Ser: 0.92 mg/dL (ref 0.57–1.00)
Glucose: 89 mg/dL (ref 70–99)
Potassium: 4.4 mmol/L (ref 3.5–5.2)
Sodium: 137 mmol/L (ref 134–144)
eGFR: 83 mL/min/{1.73_m2} (ref >59)

## 2022-12-22 MED ORDER — CLONAZEPAM 0.5 MG PO TABS: 0.5000 mg | ORAL_TABLET | Freq: Every day | ORAL | 1 refills | Status: DC

## 2022-12-22 MED ORDER — OMEPRAZOLE 20 MG PO CPDR: 20.0000 mg | DELAYED_RELEASE_CAPSULE | Freq: Every day | ORAL | 3 refills | Status: DC

## 2022-12-22 NOTE — Progress Notes (Signed)
Subjective:   HPI  Nancy Jenkins is a 37 y.o. female who presents for Chief Complaint  Patient presents with   Annual Exam    Fasting annual exam, no vision exam sees eye doctor annually. ([facility]). Also sees [facility]. Due for Tdap today.     Patient Care Team: Harriett Azar, Cleda Mccreedyavid S, PA-C as PCP - General (Family Medicine) Sees dentist Sees eye doctor Dr. Reino Bellisimothy Draper, sports medicine Dr. Luna KitchensKathryn Kooistra, gynecology   Concerns: Started Contrave in 09/2022 for weight management.   Has done ok on this, has lost weight.  Was 207lb initially, and this week 193lb.   Exercising regularly.   Also doing meatless day per week.  Trying to eat more plant based diet.    GERD - still takes prilosec some, not every day  Night terrors - sometimes uses 1/2 of clonazepam, but does still use this regularly  Feels her stress levels are management  Daughter broke both arms jumping off playground this past fall 2023.   Past Medical History:  Diagnosis Date   GERD (gastroesophageal reflux disease)    Night terrors    Tension headache    Thyroid activity decreased    in past   Wears contact lenses     Family History  Problem Relation Age of Onset   Diabetes Mother    Depression Mother    Hypertension Father    Deep vein thrombosis Father    Ulcerative colitis Father    Heart disease Brother        congenital   Hypertension Maternal Grandmother    Cancer Maternal Grandfather        larynx   Hypertension Paternal Grandmother    Stroke Paternal Grandmother    Cancer Paternal Grandfather        liver     Current Outpatient Medications:    cholecalciferol (VITAMIN D3) 25 MCG (1000 UNIT) tablet, Take 1 tablet (1,000 Units total) by mouth daily., Disp: 90 tablet, Rfl: 3   etonogestrel (NEXPLANON) 68 MG IMPL implant, 1 each by Subdermal route once., Disp: , Rfl:    Naltrexone-buPROPion HCl ER (CONTRAVE) 8-90 MG TB12, Take 1 tablet by mouth in the morning  and at bedtime., Disp: , Rfl:    clonazePAM (KLONOPIN) 0.5 MG tablet, Take 1 tablet (0.5 mg total) by mouth daily., Disp: 90 tablet, Rfl: 1   omeprazole (PRILOSEC) 20 MG capsule, Take 1 capsule (20 mg total) by mouth daily., Disp: 90 capsule, Rfl: 3  No Known Allergies     12/22/2022    9:20 AM 10/03/2021    9:14 AM 05/02/2021    9:47 AM 11/11/2020    9:26 AM 11/13/2019   12:02 PM  Depression screen PHQ 2/9  Decreased Interest 0 0 0 0 0  Down, Depressed, Hopeless 0 0 0 0 0  PHQ - 2 Score 0 0 0 0 0     Reviewed their medical, surgical, family, social, medication, and allergy history and updated chart as appropriate.   Review of Systems  Constitutional:  Negative for chills, fever, malaise/fatigue and weight loss.  HENT:  Negative for congestion, ear pain, hearing loss, sore throat and tinnitus.   Eyes:  Negative for blurred vision, pain and redness.  Respiratory:  Negative for cough, hemoptysis and shortness of breath.   Cardiovascular:  Negative for chest pain, palpitations, orthopnea, claudication and leg swelling.  Gastrointestinal:  Negative for abdominal pain, blood in stool, constipation, diarrhea, nausea and vomiting.  Genitourinary:  Negative for dysuria, flank pain, frequency, hematuria and urgency.  Musculoskeletal:  Negative for falls, joint pain and myalgias.  Skin:  Negative for itching and rash.  Neurological:  Negative for dizziness, tingling, speech change, weakness and headaches.  Endo/Heme/Allergies:  Negative for polydipsia. Does not bruise/bleed easily.  Psychiatric/Behavioral:  Negative for depression and memory loss. The patient is not nervous/anxious and does not have insomnia.         Objective:  BP 120/70   Pulse 87   Ht 5' 5.5" (1.664 m)   Wt 195 lb 6.4 oz (88.6 kg)   SpO2 97%   BMI 32.02 kg/m   Wt Readings from Last 3 Encounters:  12/22/22 195 lb 6.4 oz (88.6 kg)  06/20/22 202 lb (91.6 kg)  02/23/22 197 lb (89.4 kg)    General appearance:  alert, no distress, WD/WN, Caucasian female Skin: unremarkable, tattoo scorpion mid back midline HEENT: normocephalic, conjunctiva/corneas normal, sclerae anicteric, PERRLA, EOMi Neck: supple, no lymphadenopathy, no thyromegaly, no masses, normal ROM, no bruits Chest: non tender, normal shape and expansion Heart: RRR, normal S1, S2, no murmurs Lungs: CTA bilaterally, no wheezes, rhonchi, or rales Abdomen: +bs, soft, non tender, non distended, no masses, no hepatomegaly, no splenomegaly, no bruits Back: non tender, normal ROM, no scoliosis Musculoskeletal: upper extremities non tender, no obvious deformity, normal ROM throughout, lower extremities non tender, no obvious deformity, normal ROM throughout Extremities: no edema, no cyanosis, no clubbing Pulses: 2+ symmetric, upper and lower extremities, normal cap refill Neurological: alert, oriented x 3, CN2-12 intact, strength normal upper extremities and lower extremities, sensation normal throughout, DTRs 2+ throughout, no cerebellar signs, gait normal Psychiatric: normal affect, behavior normal, pleasant  Breast/gyn/rectal - deferred to gynecology     Assessment and Plan :   Encounter Diagnoses  Name Primary?   Encounter for health maintenance examination in adult Yes   Vitamin D deficiency    Gastroesophageal reflux disease without esophagitis    Nexplanon in place    Screening for lipid disorders    Night terrors    Vaccine counseling     Today you had a preventative care visit or wellness visit.    Topics today may have included healthy lifestyle, diet, exercise, preventative care, vaccinations, sick and well care, proper use of emergency dept and after hours care, as well as other concerns.     Recommendations: Continue to return yearly for your annual wellness and preventative care visits.  This gives [place] a chance to discuss healthy lifestyle, exercise, vaccinations, review your chart record, and perform screenings where  appropriate.  I recommend you see your eye doctor yearly for routine vision care.  I recommend you see your dentist yearly for routine dental care including hygiene visits twice yearly.  See your gynecologist yearly for routine gynecological care.    Vaccination recommendations were reviewed Immunization History  Administered Date(s) Administered   Influenza Split 06/16/2021, 05/19/2022   Influenza-Unspecified 08/16/2011, 06/13/2017, 05/14/2018, 06/02/2020   MMR 01/17/2013   Moderna Sars-Covid-2 Vaccination 09/18/2019, 10/17/2019, 09/24/2020   PPD Test 11/18/2013   Td 12/13/2004   Tdap 11/01/2012, 12/22/2022   Counseled on the Tdap (tetanus, diptheria, and acellular pertussis) vaccine.  Vaccine information sheet given. Tdap vaccine given after consent obtained.    Screening for cancer: Breast cancer screening: You should perform a self breast exam monthly.   We reviewed recommendations for regular mammograms and breast cancer screening.  Colon cancer screening:  Age 45yo  Cervical cancer screening: We reviewed recommendations for  pap smear screening.  2023 pap reviewed  Skin cancer screening: Check your skin regularly for new changes, growing lesions, or other lesions of concern Come in for evaluation if you have skin lesions of concern.  Lung cancer screening: If you have a greater than 30 pack year history of tobacco use, then you qualify for lung cancer screening with a chest CT scan  We currently don't have screenings for other cancers besides breast, cervical, colon, and lung cancers.  If you have a strong family history of cancer or have other cancer screening concerns, please let me know.    Bone health: Get at least 150 minutes of aerobic exercise weekly Get weight bearing exercise at least once weekly   Heart health: Get at least 150 minutes of aerobic exercise weekly Limit alcohol It is important to maintain a healthy blood pressure and healthy  cholesterol numbers   Separate significant issues discussed: Night terrors-chronic, but has done well on current therapy ongoing.  Uses clonazepam most nights.     GERD-continue omeprazole, advised trigger avoidance  Vit D deficiency - continue supplement  Weight management - per weight loss clinic, on contrave   Nancy Jenkins was seen today for annual exam.  Diagnoses and all orders for this visit:  Encounter for health maintenance examination in adult -     CBC -     Basic metabolic panel  Vitamin D deficiency  Gastroesophageal reflux disease without esophagitis -     omeprazole (PRILOSEC) 20 MG capsule; Take 1 capsule (20 mg total) by mouth daily.  Nexplanon in place  Screening for lipid disorders  Night terrors  Vaccine counseling  Other orders -     Tdap vaccine greater than or equal to 7yo IM -     clonazePAM (KLONOPIN) 0.5 MG tablet; Take 1 tablet (0.5 mg total) by mouth daily.   Follow-up pending labs, yearly for physical

## 2022-12-25 NOTE — Progress Notes (Signed)
Results sent through MyChart

## 2023-01-11 DIAGNOSIS — M25531 Pain in right wrist: Secondary | ICD-10-CM | POA: Diagnosis not present

## 2023-01-11 DIAGNOSIS — S52301A Unspecified fracture of shaft of right radius, initial encounter for closed fracture: Secondary | ICD-10-CM | POA: Diagnosis not present

## 2023-01-19 DIAGNOSIS — S52301D Unspecified fracture of shaft of right radius, subsequent encounter for closed fracture with routine healing: Secondary | ICD-10-CM | POA: Diagnosis not present

## 2023-02-02 DIAGNOSIS — S52301D Unspecified fracture of shaft of right radius, subsequent encounter for closed fracture with routine healing: Secondary | ICD-10-CM | POA: Diagnosis not present

## 2023-03-13 ENCOUNTER — Encounter: Payer: Self-pay | Admitting: Obstetrics and Gynecology

## 2023-03-13 ENCOUNTER — Ambulatory Visit (INDEPENDENT_AMBULATORY_CARE_PROVIDER_SITE_OTHER): Payer: BC Managed Care – PPO | Admitting: Obstetrics and Gynecology

## 2023-03-13 VITALS — BP 117/76 | HR 88 | Wt 189.8 lb

## 2023-03-13 DIAGNOSIS — Z3202 Encounter for pregnancy test, result negative: Secondary | ICD-10-CM | POA: Diagnosis not present

## 2023-03-13 DIAGNOSIS — Z3046 Encounter for surveillance of implantable subdermal contraceptive: Secondary | ICD-10-CM

## 2023-03-13 HISTORY — PX: INSERTION OF IMPLANON ROD: OBO 1005

## 2023-03-13 HISTORY — PX: REMOVAL OF IMPLANON ROD: OBO 1006

## 2023-03-13 LAB — POCT URINE PREGNANCY: Preg Test, Ur: NEGATIVE

## 2023-03-13 MED ORDER — ETONOGESTREL 68 MG ~~LOC~~ IMPL
68.0000 mg | DRUG_IMPLANT | Freq: Once | SUBCUTANEOUS | Status: AC
  Administered 2023-03-13: 68 mg via SUBCUTANEOUS

## 2023-03-13 NOTE — Procedures (Signed)
Nexplanon expires October 2024. Patient desires another one now because she is now having qmonth periods as opposed to being amenorrheic initially and then rare periods on the nexplanon. Per MS, patient okay for early, new Nexplanon   After informed consent was obtained, the patient's left arm was examined and the Nexplanon rod was noted to be easily palpable. The area was cleaned with alcohol then local anesthesia was infiltrated with 2 ml of 1% lidocaine with epinephrine. The area was prepped with betadine. Using sterile technique, an 11 blade was used to make an incision , and the Nexplanon device was brought to the incision site. The capsule was scrapped off with the scalpel, the Nexplanon grasped with hemostats, and easily removed and intact The site is approximately 8 cm proximal to the medial epicondyle in the sulcus between the biceps and triceps on the inner surface. The area was cleaned again with betadine with alcohol then local anesthesia was infiltrated with 3 ml of 1% lidocaine with epinephrinealong the planned insertion track. Using sterile technique the Nexplanon device was inserted per manufacturer's guidelines in the subdermal connective tissue using the standard insertion technique without difficulty. Pressure was applied and the insertion site was hemostatic. The presence of the Nexplanon was confirmed immediately after insertion by palpation by both me and the patient and by checking the tip of needle for the absence of the insert.  A pressure dressing was applied.   The patient tolerated the procedure well.   Cornelia Copa MD Attending [facility])

## 2023-03-13 NOTE — Progress Notes (Signed)
CC: Denies any concerns, nexplanon removal and new reinsertion

## 2023-06-19 ENCOUNTER — Telehealth: Payer: BC Managed Care – PPO | Admitting: Medical

## 2023-06-19 ENCOUNTER — Encounter: Payer: Self-pay | Admitting: Medical

## 2023-06-19 VITALS — HR 77 | Temp 97.7°F | Ht 65.5 in | Wt 183.0 lb

## 2023-06-19 DIAGNOSIS — K219 Gastro-esophageal reflux disease without esophagitis: Secondary | ICD-10-CM | POA: Diagnosis not present

## 2023-06-19 DIAGNOSIS — E282 Polycystic ovarian syndrome: Secondary | ICD-10-CM

## 2023-06-19 DIAGNOSIS — Z6829 Body mass index (BMI) 29.0-29.9, adult: Secondary | ICD-10-CM

## 2023-06-19 DIAGNOSIS — E559 Vitamin D deficiency, unspecified: Secondary | ICD-10-CM

## 2023-06-19 DIAGNOSIS — Z79899 Other long term (current) drug therapy: Secondary | ICD-10-CM

## 2023-06-19 MED ORDER — CONTRAVE 8-90 MG PO TB12: 2.0000 | ORAL_TABLET | Freq: Two times a day (BID) | ORAL | 2 refills | Status: DC

## 2023-06-19 NOTE — Progress Notes (Signed)
Subjective:     Patient ID: Nancy Jenkins, female   DOB: 1986/05/17, 37 y.o.   MRN: [id number]  This visit type was conducted due to national recommendations for restrictions regarding the COVID-19 Pandemic (e.g. social distancing) in an effort to limit this patient's exposure and mitigate transmission in our community.  Due to their co-morbid illnesses, this patient is at least at moderate risk for complications without adequate follow up.  This format is felt to be most appropriate for this patient at this time.    Documentation for virtual audio and video telecommunications through [place] encounter:  The patient was located at home. The provider was located in the office. The patient did consent to this visit and is aware of possible charges through their insurance for this visit.  The other persons participating in this telemedicine service were none. Time spent on call was 20 minutes and in review of previous records 20 minutes total.  This virtual service is not related to other E/M service within previous 7 days.   HPI Chief Complaint  Patient presents with   Medication Refill    VIRTUAL refill on contrave.    Virtual consult for weight loss medications.  She has been seeing weight loss clinic, using Contrave.  Started Contrave at beginning of January.   Started medication around 204lb.  Now down to 183lb.    Prior medicaiton include prior Contrave, tried GLP1 medicaiton but gave her nausea and GERD.  Had to stop the GLP1 medication.  No prior use of Qsymia or Belviq.  Avoids caffeine and didn't want to try the Qsymia or appetite suppressant  Exercise - 2-3 days per week walking, 1-2 times weekly weight bearing.   Current diet - very light breakfast, lunch, dinner, some fruits, but a good variety of vegetables.    She is seeing specialist currently due to hand injury.  She and her husband were moving a ping-pong to able and the table ended up coming down on her hand  pinning her hand in between the joint causing a fracture of her fifth metacarpal.  She is having to wear a brace and has another week or so left of wearing the brace.  No other aggravating or relieving factors. No other complaint.   Past Medical History:  Diagnosis Date   GERD (gastroesophageal reflux disease)    Night terrors    Tension headache    Thyroid activity decreased    in past   Wears contact lenses    Current Outpatient Medications on File Prior to Visit  Medication Sig Dispense Refill   cholecalciferol (VITAMIN D3) 25 MCG (1000 UNIT) tablet Take 1 tablet (1,000 Units total) by mouth daily. 90 tablet 3   etonogestrel (NEXPLANON) 68 MG IMPL implant 1 each by Subdermal route once.     omeprazole (PRILOSEC) 20 MG capsule Take 1 capsule (20 mg total) by mouth daily. 90 capsule 3   clonazePAM (KLONOPIN) 0.5 MG tablet Take 1 tablet (0.5 mg total) by mouth daily. (Patient not taking: Reported on 06/19/2023) 90 tablet 1   No current facility-administered medications on file prior to visit.     Review of Systems As in subjective    Objective:   Physical Exam Due to coronavirus pandemic stay at home measures, patient visit was virtual and they were not examined in person.   Pulse 77   Temp 97.7 F (36.5 C)   Ht 5' 5.5" (1.664 m)   Wt 183 lb (83 kg)   SpO2  99%   BMI 29.99 kg/m   Gen: wd, wn ,nad Splint applied to left hand from recent hand injury     Assessment:     Encounter Diagnoses  Name Primary?   Gastroesophageal reflux disease without esophagitis Yes   BMI 29.0-29.9,adult    PCOS (polycystic ovarian syndrome)    Vitamin D deficiency    High risk medication use         Plan:     Doing fine on current therapy.  Continue routine exercise, healthy eating habits, continue Contrave medication to twice daily  I reviewed the labs from her last physical and her other health concerns.  Continue other medicines as usual  Congratulated her on her weight loss  so far with this medication and maintaining a healthier weight.  Follow-up in 3 months  Nancy Jenkins was seen today for medication refill.  Diagnoses and all orders for this visit:  Gastroesophageal reflux disease without esophagitis  BMI 29.0-29.9,adult  PCOS (polycystic ovarian syndrome)  Vitamin D deficiency  High risk medication use  Other orders -     Naltrexone-buPROPion HCl ER (CONTRAVE) 8-90 MG TB12; Take 2 tablets by mouth in the morning and at bedtime.    F/u 80mo

## 2023-07-10 ENCOUNTER — Other Ambulatory Visit: Payer: Self-pay | Admitting: Medical

## 2023-07-10 MED ORDER — CLONAZEPAM 0.5 MG PO TABS: 0.5000 mg | ORAL_TABLET | Freq: Every day | ORAL | 1 refills | Status: DC

## 2023-09-24 DIAGNOSIS — H00011 Hordeolum externum right upper eyelid: Secondary | ICD-10-CM | POA: Diagnosis not present

## 2023-11-28 ENCOUNTER — Other Ambulatory Visit: Payer: Self-pay | Admitting: Medical

## 2023-11-28 DIAGNOSIS — K219 Gastro-esophageal reflux disease without esophagitis: Secondary | ICD-10-CM

## 2023-12-24 ENCOUNTER — Other Ambulatory Visit: Payer: Self-pay | Admitting: Internal Medicine

## 2023-12-24 DIAGNOSIS — K219 Gastro-esophageal reflux disease without esophagitis: Secondary | ICD-10-CM

## 2023-12-24 MED ORDER — OMEPRAZOLE 20 MG PO CPDR
DELAYED_RELEASE_CAPSULE | ORAL | 0 refills | Status: DC
Start: 2023-12-24 — End: 2024-01-24

## 2023-12-26 ENCOUNTER — Ambulatory Visit (INDEPENDENT_AMBULATORY_CARE_PROVIDER_SITE_OTHER): Payer: BC Managed Care – PPO | Admitting: Medical

## 2023-12-26 ENCOUNTER — Encounter: Payer: Self-pay | Admitting: Medical

## 2023-12-26 VITALS — BP 120/80 | HR 86 | Ht 67.25 in | Wt 179.4 lb

## 2023-12-26 DIAGNOSIS — Z13220 Encounter for screening for lipoid disorders: Secondary | ICD-10-CM

## 2023-12-26 DIAGNOSIS — R7989 Other specified abnormal findings of blood chemistry: Secondary | ICD-10-CM

## 2023-12-26 DIAGNOSIS — Z131 Encounter for screening for diabetes mellitus: Secondary | ICD-10-CM | POA: Diagnosis not present

## 2023-12-26 DIAGNOSIS — Z0000 Encounter for general adult medical examination without abnormal findings: Secondary | ICD-10-CM

## 2023-12-26 DIAGNOSIS — Z975 Presence of (intrauterine) contraceptive device: Secondary | ICD-10-CM

## 2023-12-26 DIAGNOSIS — F514 Sleep terrors [night terrors]: Secondary | ICD-10-CM

## 2023-12-26 LAB — LIPID PANEL

## 2023-12-26 MED ORDER — CONTRAVE 8-90 MG PO TB12: 2.0000 | ORAL_TABLET | Freq: Two times a day (BID) | ORAL | 2 refills | Status: AC

## 2023-12-26 MED ORDER — CYCLOBENZAPRINE HCL 10 MG PO TABS: 10.0000 mg | ORAL_TABLET | Freq: Two times a day (BID) | ORAL | 0 refills | Status: DC | PRN

## 2023-12-26 MED ORDER — CLONAZEPAM 0.5 MG PO TABS: 0.5000 mg | ORAL_TABLET | Freq: Every day | ORAL | 1 refills | Status: DC

## 2023-12-26 NOTE — Progress Notes (Signed)
 Subjective:   HPI  Nancy Jenkins is a 38 y.o. female who presents for Chief Complaint  Patient presents with   Annual Exam    Fasting cpe, no concerns    Patient Care Team: Keelia Graybill, Kermit Balo, PA-C as PCP - General (Family Medicine) [place] Bing, MD as Consulting Physician (Obstetrics and Gynecology) Sees dentist Sees eye doctor Dr. Reino Bellis, sports medicine Dr. Luna Kitchens, gynecology   Concerns: Doing ok in general  She notes some back spasm.   Yesterday her dog jerked quickly causing her to strain mid back.  Using ibuprofen and heat.   Requesting a few muscle relaxers to help.    Started Contrave in 09/2022 for weight management.   Has done ok on this, has lost weight.  Was 207lb initially, and this week 193lb.   Exercising regularly.  On average uses Contrave 1-2 tablets daily.  Using 2 BID was causing dysuria. That resolved when she lowered the dose.  GERD - still takes Prilosec some, not every day  Night terrors -  uses 1/2-1 tablet clonazepam regularly, long term, and this seems to work well  She uses OTC multivitamin with extra vitamin D   Past Medical History:  Diagnosis Date   GERD (gastroesophageal reflux disease)    Night terrors    Tension headache    Thyroid activity decreased    in past   Wears contact lenses     Family History  Problem Relation Age of Onset   Diabetes Mother    Depression Mother    Hypertension Father    Deep vein thrombosis Father    Ulcerative colitis Father    Heart disease Brother        congenital   Hypertension Maternal Grandmother    Cancer Maternal Grandfather        larynx   Hypertension Paternal Grandmother    Stroke Paternal Grandmother    Cancer Paternal Grandfather        liver     Current Outpatient Medications:    cyclobenzaprine (FLEXERIL) 10 MG tablet, Take 1 tablet (10 mg total) by mouth 2 (two) times daily as needed for muscle spasms., Disp: 20 tablet, Rfl: 0   etonogestrel (NEXPLANON) 68  MG IMPL implant, 1 each by Subdermal route once., Disp: , Rfl:    omeprazole (PRILOSEC) 20 MG capsule, TAKE 1 CAPSULE EVERY DAY -GENERIC FOR PRILOSEC, Disp: 90 capsule, Rfl: 0   clonazePAM (KLONOPIN) 0.5 MG tablet, Take 1 tablet (0.5 mg total) by mouth daily., Disp: 90 tablet, Rfl: 1   Naltrexone-buPROPion HCl ER (CONTRAVE) 8-90 MG TB12, Take 2 tablets by mouth in the morning and at bedtime., Disp: 120 tablet, Rfl: 2  Allergies  Allergen Reactions   Saxenda [Liraglutide -Weight Management]     Bad GERD flare       12/26/2023    8:09 AM 12/22/2022    9:20 AM 10/03/2021    9:14 AM 05/02/2021    9:47 AM 11/11/2020    9:26 AM  Depression screen PHQ 2/9  Decreased Interest  0 0 0 0  Down, Depressed, Hopeless 0 0 0 0 0  PHQ - 2 Score 0 0 0 0 0     Reviewed their medical, surgical, family, social, medication, and allergy history and updated chart as appropriate.   Review of Systems  Constitutional:  Negative for chills, fever, malaise/fatigue and weight loss.  HENT:  Negative for congestion, ear pain, hearing loss, sore throat and tinnitus.   Eyes:  Negative for blurred vision, pain and redness.  Respiratory:  Negative for cough, hemoptysis and shortness of breath.   Cardiovascular:  Negative for chest pain, palpitations, orthopnea, claudication and leg swelling.  Gastrointestinal:  Negative for abdominal pain, blood in stool, constipation, diarrhea, nausea and vomiting.  Genitourinary:  Negative for dysuria, flank pain, frequency, hematuria and urgency.  Musculoskeletal:  Positive for back pain and myalgias. Negative for falls and joint pain.  Skin:  Negative for itching and rash.  Neurological:  Negative for dizziness, tingling, speech change, weakness and headaches.  Endo/Heme/Allergies:  Negative for polydipsia. Does not bruise/bleed easily.  Psychiatric/Behavioral:  Negative for depression and memory loss. The patient is not nervous/anxious and does not have insomnia.          Objective:  BP 120/80   Pulse 86   Ht 5' 7.25" (1.708 m)   Wt 179 lb 6.4 oz (81.4 kg)   BMI 27.89 kg/m   Wt Readings from Last 3 Encounters:  12/26/23 179 lb 6.4 oz (81.4 kg)  06/19/23 183 lb (83 kg)  03/13/23 189 lb 12.8 oz (86.1 kg)    General appearance: alert, no distress, WD/WN, Caucasian female Skin: unremarkable, tattoo scorpion mid back midline HEENT: normocephalic, conjunctiva/corneas normal, sclerae anicteric, PERRLA, EOMi Neck: supple, no lymphadenopathy, no thyromegaly, no masses, normal ROM, no bruits Chest: non tender, normal shape and expansion Heart: RRR, normal S1, S2, no murmurs Lungs: CTA bilaterally, no wheezes, rhonchi, or rales Abdomen: +bs, soft, non tender, non distended, no masses, no hepatomegaly, no splenomegaly, no bruits Back: mild tenderness in mid back bilat, otherwise non tender, normal ROM, no scoliosis Musculoskeletal: upper extremities non tender, no obvious deformity, normal ROM throughout, lower extremities non tender, no obvious deformity, normal ROM throughout Extremities: no edema, no cyanosis, no clubbing Pulses: 2+ symmetric, upper and lower extremities, normal cap refill Neurological: alert, oriented x 3, CN2-12 intact, strength normal upper extremities and lower extremities, sensation normal throughout, DTRs 2+ throughout, no cerebellar signs, gait normal Psychiatric: normal affect, behavior normal, pleasant  Breast/gyn/rectal - deferred to gynecology     Assessment and Plan :   Encounter Diagnoses  Name Primary?   Encounter for health maintenance examination in adult Yes   Abnormal thyroid blood test    Screening for lipid disorders    Screening for diabetes mellitus    Night terrors    Nexplanon in place     Today you had a preventative care visit or wellness visit.    Topics today may have included healthy lifestyle, diet, exercise, preventative care, vaccinations, sick and well care, proper use of emergency dept and after  hours care, as well as other concerns.     Recommendations: Continue to return yearly for your annual wellness and preventative care visits.  This gives [place] a chance to discuss healthy lifestyle, exercise, vaccinations, review your chart record, and perform screenings where appropriate.  I recommend you see your eye doctor yearly for routine vision care.  I recommend you see your dentist yearly for routine dental care including hygiene visits twice yearly.  See your gynecologist yearly for routine gynecological care.    Vaccination recommendations were reviewed Immunization History  Administered Date(s) Administered   Influenza Split 06/16/2021, 05/19/2022   Influenza-Unspecified 08/16/2011, 06/13/2017, 05/14/2018, 06/02/2020   MMR 01/17/2013   Moderna Sars-Covid-2 Vaccination 09/18/2019, 10/17/2019, 09/24/2020   PPD Test 11/18/2013   Td 12/13/2004   Tdap 11/01/2012, 12/22/2022     Screening for cancer: Breast cancer screening: You should  perform a self breast exam monthly.   We reviewed recommendations for regular mammograms and breast cancer screening.  Colon cancer screening:  Age 45yo  Cervical cancer screening: We reviewed recommendations for pap smear screening.  2023 pap reviewed  Skin cancer screening: Check your skin regularly for new changes, growing lesions, or other lesions of concern Come in for evaluation if you have skin lesions of concern.  Lung cancer screening: If you have a greater than 30 pack year history of tobacco use, then you qualify for lung cancer screening with a chest CT scan  We currently don't have screenings for other cancers besides breast, cervical, colon, and lung cancers.  If you have a strong family history of cancer or have other cancer screening concerns, please let me know.    Bone health: Get at least 150 minutes of aerobic exercise weekly Get weight bearing exercise at least once weekly   Heart health: Get at least 150  minutes of aerobic exercise weekly Limit alcohol It is important to maintain a healthy blood pressure and healthy cholesterol numbers   Separate significant issues discussed: Night terrors-chronic, has done well on current therapy ongoing.  Uses clonazepam most nights.     GERD-continue omeprazole prn, avoid triggers, acidic foods, citrus, tomato based food, peppers and spicy foods  Vit D deficiency - continue supplement  Weight management - does fine on maintenance with Contrave, 1-2 tablets daily, regular exercise, and healthy diet.  Backs spasm - continue-5 days of relative rest, stretching, ibuprofen BID, heat, and can use some short term Flexeril.  Caution with sedation.   Nancy Jenkins was seen today for annual exam.  Diagnoses and all orders for this visit:  Encounter for health maintenance examination in adult -     CBC -     Comprehensive metabolic panel with GFR -     Lipid panel -     TSH + free T4 -     Hemoglobin A1c  Abnormal thyroid blood test -     TSH + free T4  Screening for lipid disorders -     Lipid panel  Screening for diabetes mellitus -     Hemoglobin A1c  Night terrors  Nexplanon in place  Other orders -     cyclobenzaprine (FLEXERIL) 10 MG tablet; Take 1 tablet (10 mg total) by mouth 2 (two) times daily as needed for muscle spasms. -     clonazePAM (KLONOPIN) 0.5 MG tablet; Take 1 tablet (0.5 mg total) by mouth daily. -     Naltrexone-buPROPion HCl ER (CONTRAVE) 8-90 MG TB12; Take 2 tablets by mouth in the morning and at bedtime.   Follow-up pending labs, yearly for physical

## 2023-12-27 ENCOUNTER — Other Ambulatory Visit: Payer: Self-pay | Admitting: Medical

## 2023-12-27 LAB — HEMOGLOBIN A1C
Est. average glucose Bld gHb Est-mCnc: 111 mg/dL
Hgb A1c MFr Bld: 5.5 % (ref 4.8–5.6)

## 2023-12-27 LAB — COMPREHENSIVE METABOLIC PANEL WITH GFR
ALT: 8 IU/L (ref 0–32)
AST: 13 IU/L (ref 0–40)
Albumin: 4.1 g/dL (ref 3.9–4.9)
Alkaline Phosphatase: 60 IU/L (ref 44–121)
BUN/Creatinine Ratio: 9 (ref 9–23)
BUN: 8 mg/dL (ref 6–20)
Bilirubin Total: 0.3 mg/dL (ref 0.0–1.2)
CO2: 22 mmol/L (ref 20–29)
Calcium: 9.2 mg/dL (ref 8.7–10.2)
Chloride: 104 mmol/L (ref 96–106)
Creatinine, Ser: 0.91 mg/dL (ref 0.57–1.00)
Globulin, Total: 2.7 g/dL (ref 1.5–4.5)
Glucose: 86 mg/dL (ref 70–99)
Potassium: 4.3 mmol/L (ref 3.5–5.2)
Sodium: 141 mmol/L (ref 134–144)
Total Protein: 6.8 g/dL (ref 6.0–8.5)
eGFR: 83 mL/min/{1.73_m2} (ref >59)

## 2023-12-27 LAB — CBC
Hematocrit: 38.7 % (ref 34.0–46.6)
Hemoglobin: 12.8 g/dL (ref 11.1–15.9)
MCH: 30.8 pg (ref 26.6–33.0)
MCHC: 33.1 g/dL (ref 31.5–35.7)
MCV: 93 fL (ref 79–97)
Platelets: 311 10*3/uL (ref 150–450)
RBC: 4.16 x10E6/uL (ref 3.77–5.28)
RDW: 12.9 % (ref 11.7–15.4)
WBC: 6.5 10*3/uL (ref 3.4–10.8)

## 2023-12-27 LAB — LIPID PANEL
Cholesterol, Total: 138 mg/dL (ref 100–199)
HDL: 40 mg/dL (ref >39)
LDL CALC COMMENT:: 3.5 ratio (ref 0.0–4.4)
LDL Chol Calc (NIH): 82 mg/dL (ref 0–99)
Triglycerides: 80 mg/dL (ref 0–149)
VLDL Cholesterol Cal: 16 mg/dL (ref 5–40)

## 2023-12-27 LAB — TSH+FREE T4
Free T4: 1.12 ng/dL (ref 0.82–1.77)
TSH: 5.66 u[IU]/mL — ABNORMAL HIGH (ref 0.450–4.500)

## 2023-12-27 NOTE — Progress Notes (Signed)
 Results sent through MyChart

## 2024-01-24 DIAGNOSIS — K219 Gastro-esophageal reflux disease without esophagitis: Secondary | ICD-10-CM

## 2024-01-24 MED ORDER — OMEPRAZOLE 20 MG PO CPDR: DELAYED_RELEASE_CAPSULE | ORAL | 0 refills | Status: DC

## 2024-02-07 ENCOUNTER — Other Ambulatory Visit: Payer: Self-pay | Admitting: Medical

## 2024-02-07 MED ORDER — PRAZOSIN HCL 1 MG PO CAPS: 1.0000 mg | ORAL_CAPSULE | Freq: Every day | ORAL | 0 refills | Status: DC

## 2024-03-07 ENCOUNTER — Other Ambulatory Visit: Payer: Self-pay | Admitting: Medical

## 2024-03-10 ENCOUNTER — Other Ambulatory Visit: Payer: Self-pay | Admitting: Medical

## 2024-03-10 MED ORDER — IMIPRAMINE HCL 10 MG PO TABS: 10.0000 mg | ORAL_TABLET | Freq: Every day | ORAL | 0 refills | Status: DC

## 2024-04-07 ENCOUNTER — Telehealth (INDEPENDENT_AMBULATORY_CARE_PROVIDER_SITE_OTHER): Admitting: Medical

## 2024-04-07 VITALS — BP 118/72 | HR 85 | Wt 179.0 lb

## 2024-04-07 DIAGNOSIS — F514 Sleep terrors [night terrors]: Secondary | ICD-10-CM

## 2024-04-07 DIAGNOSIS — G479 Sleep disorder, unspecified: Secondary | ICD-10-CM

## 2024-04-07 MED ORDER — CLONAZEPAM 0.5 MG PO TABS: 0.5000 mg | ORAL_TABLET | Freq: Every day | ORAL | 1 refills | Status: DC

## 2024-04-07 NOTE — Progress Notes (Signed)
 Subjective:     Patient ID: Nancy Jenkins, female   DOB: 20-Jul-1986, 38 y.o.   MRN: [id number]  This visit type was conducted due to national recommendations for restrictions regarding the COVID-19 Pandemic (e.g. social distancing) in an effort to limit this patient's exposure and mitigate transmission in our community.  Due to their co-morbid illnesses, this patient is at least at moderate risk for complications without adequate follow up.  This format is felt to be most appropriate for this patient at this time.    Documentation for virtual audio and video telecommunications through [place] encounter:  The patient was located at home. The provider was located in the office. The patient did consent to this visit and is aware of possible charges through their insurance for this visit.  The other persons participating in this telemedicine service were none. Time spent on call was 20 minutes and in review of previous records 20 minutes total.  This virtual service is not related to other E/M service within previous 7 days.   HPI Chief Complaint  Patient presents with   Consult    Discuss sleeping and medicine   Virtual to follow-up on sleep issues, sleep medicine and night terrors  She has a history of night terrors.  She had a sleep study years ago maybe 2008.  She was sleeping good and woke up at 2 AM screaming.  People at the sleep center rushed in to calm her down and she went right back to sleep and she had no recollection of it later  She checks her Apple Watch, smart watch and it says her sleep quality is at 90%  Nevertheless she continues to deal with night terrors and sleep issues.  She wonders if it is partly hormonal related.  As she has gotten older it seems to be a little worse  Recently we tried Minipress .  It helped some but she felt quite nauseated and lightheaded the next day so she decided not to continue with this medication.  More recently she tried imipramine   but that made her feel awful, could not sleep at all.  She wants to stick with her clonazepam  that she has used for a long time  She tried samples of Silenor years ago but that also made her feel awful  She is on Nexplanon .  Periods are fairly regular  Seeing counseling currently every 3-4 weeks.  She does journal in the evenings.  She practices good sleep hygiene measures  No new changes in stress.  She continues on Contrave  for weight management   Past Medical History:  Diagnosis Date   GERD (gastroesophageal reflux disease)    Night terrors    Tension headache    Thyroid  activity decreased    in past   Wears contact lenses    Current Outpatient Medications on File Prior to Visit  Medication Sig Dispense Refill   clonazePAM  (KLONOPIN ) 0.5 MG tablet Take 1 tablet (0.5 mg total) by mouth daily. 90 tablet 1   etonogestrel  (NEXPLANON ) 68 MG IMPL implant 1 each by Subdermal route once.     Naltrexone -buPROPion  HCl ER (CONTRAVE ) 8-90 MG TB12 Take 2 tablets by mouth in the morning and at bedtime. 120 tablet 2   omeprazole  (PRILOSEC) 20 MG capsule TAKE 1 CAPSULE EVERY DAY -GENERIC FOR PRILOSEC 90 capsule 0   No current facility-administered medications on file prior to visit.    Review of Systems As in subjective    Objective:   Physical Exam Due to  coronavirus pandemic stay at home measures, patient visit was virtual and they were not examined in person.   BP 118/72   Pulse 85   Wt 179 lb (81.2 kg)   BMI 27.83 kg/m   Gen: wd, wn ,nad Psych: Pleasant, answers questions appropriately     Assessment:     Encounter Diagnoses  Name Primary?   Night terrors Yes   Sleep disturbance        Plan:     Unfortunately her 2 recent trials, Minipress , and imipramine  to help with night terrors did not work well due to medication adverse effects  She will continue the clonazepam  as she has used for many years.  She is taking some new supplements over-the-counter that may help  as well.  Discussed reducing stress for possible, continue with counseling, continue with journaling in the evening as therapy  for stress and sleep.  Continue with regular exercise.  Going forward we discussed other sleep aids that she wanted to try something different such as trazodone, Seroquel, Restoril.  Nancy Jenkins was seen today for consult.  Diagnoses and all orders for this visit:  Night terrors  Sleep disturbance    F/u prn

## 2024-04-09 ENCOUNTER — Other Ambulatory Visit: Payer: Self-pay | Admitting: Medical

## 2024-04-09 NOTE — Telephone Encounter (Signed)
This was discontinued due to side effects.

## 2024-04-21 ENCOUNTER — Other Ambulatory Visit: Payer: Self-pay | Admitting: Medical

## 2024-04-21 DIAGNOSIS — K219 Gastro-esophageal reflux disease without esophagitis: Secondary | ICD-10-CM

## 2024-05-08 NOTE — Telephone Encounter (Signed)
 FYI

## 2024-05-14 ENCOUNTER — Ambulatory Visit
Admission: RE | Admit: 2024-05-14 | Discharge: 2024-05-14 | Disposition: A | Discharge status: Home / Self Care | Source: Ambulatory Visit | Attending: Medical | Admitting: Medical

## 2024-05-14 ENCOUNTER — Ambulatory Visit (INDEPENDENT_AMBULATORY_CARE_PROVIDER_SITE_OTHER): Admitting: Medical

## 2024-05-14 VITALS — BP 120/70 | HR 88 | Ht 66.0 in | Wt 188.0 lb

## 2024-05-14 DIAGNOSIS — R0683 Snoring: Secondary | ICD-10-CM

## 2024-05-14 DIAGNOSIS — G479 Sleep disorder, unspecified: Secondary | ICD-10-CM | POA: Diagnosis not present

## 2024-05-14 DIAGNOSIS — T753XXA Motion sickness, initial encounter: Secondary | ICD-10-CM

## 2024-05-14 DIAGNOSIS — R7989 Other specified abnormal findings of blood chemistry: Secondary | ICD-10-CM | POA: Diagnosis not present

## 2024-05-14 DIAGNOSIS — M50322 Other cervical disc degeneration at C5-C6 level: Secondary | ICD-10-CM | POA: Diagnosis not present

## 2024-05-14 DIAGNOSIS — M542 Cervicalgia: Secondary | ICD-10-CM | POA: Diagnosis not present

## 2024-05-14 DIAGNOSIS — R110 Nausea: Secondary | ICD-10-CM

## 2024-05-14 NOTE — Progress Notes (Signed)
 Subjective:  Nancy Jenkins is a 38 y.o. female who presents for Chief Complaint  Patient presents with   Acute Visit    Recheck thyroid  labs, snoring and not sleeping well and neck pain     Here for a few different concerns.  Sleep has not been good of late.  Waking up intermittently, but feels tension in neck, neck pain.  Has tried various different pillows without improvement  Carries a lot of tension in neck in general.   Worse neck pain of late, tight in neck. In left neck felt tingling recently.   When she was stretching neck she had a sudden pop and pain and tingling down left arm.  In last 3 months, husband says she is snoring more, worse lying on left side.   No apnea events.    In last year, motion sickness is worse.  Turning fast, spinning around makes her nauseated.   Concerns about thyroid .   Had sleep evaluation in 2010 that didn't show sleep apnea.  No other aggravating or relieving factors.    No other c/o.  Past Medical History:  Diagnosis Date   GERD (gastroesophageal reflux disease)    Night terrors    Tension headache    Thyroid  activity decreased    in past   Wears contact lenses    Current Outpatient Medications on File Prior to Visit  Medication Sig Dispense Refill   clonazePAM  (KLONOPIN ) 0.5 MG tablet Take 1 tablet (0.5 mg total) by mouth daily. 90 tablet 1   etonogestrel  (NEXPLANON ) 68 MG IMPL implant 1 each by Subdermal route once.     Naltrexone -buPROPion  HCl ER (CONTRAVE ) 8-90 MG TB12 Take 2 tablets by mouth in the morning and at bedtime. 120 tablet 2   omeprazole  (PRILOSEC) 20 MG capsule TAKE 1 CAPSULE BY MOUTH DAILY 90 capsule 1   No current facility-administered medications on file prior to visit.    The following portions of the patient's history were reviewed and updated as appropriate: allergies, current medications, past family history, past medical history, past social history, past surgical history and problem list.  ROS Otherwise as in  subjective above     Objective: BP 120/70   Pulse 88   Ht 5' 6 (1.676 m)   Wt 188 lb (85.3 kg)   SpO2 100%   BMI 30.34 kg/m   General appearance: alert, no distress, well developed, well nourished HEENT: normocephalic, sclerae anicteric, conjunctiva pink and moist, TMs pearly, nares with some turbinate hypertrophy, no discharge or erythema, pharynx normal, on enlarged tonsils Oral cavity: MMM, no lesions Neck: supple, mild tenderness of bilateral posterior neck, slightly decreased extension of neck, otherwise range of motion normal, no lymphadenopathy, no thyromegaly, no masses Heart: RRR, normal S1, S2, no murmurs Lungs: CTA bilaterally, no wheezes, rhonchi, or rales Arms with normal ROM, no laxity, no swelling or deformity  Pulses: 2+ radial pulses, 2+ pedal pulses, normal cap refill Ext: no edema Arms neurovascularly intact    Assessment: Encounter Diagnoses  Name Primary?   Neck pain Yes   Abnormal thyroid  blood test    Sleep disturbance    Motion sickness, initial encounter    Nausea    Snoring      Plan: Neck pain-we discussed using different pillows to try to keep this spine in alignment while sleeping, have husband look to see if she  appears aligned.  Go for baseline x-ray.  Consider MRI of the neck.  Sleep disturbance-somewhat attributable to neck pain possibly  other etiology.  Consider sleep evaluation.  She does have a remote history of similar evaluation  Motion sickness, nausea-possibly related to Contrave  medication.  History of thyroid  labs test -  updated labs today   [name] was seen today for acute visit.  Diagnoses and all orders for this visit:  Neck pain -     DG Cervical Spine Complete; Future  Abnormal thyroid  blood test -     TSH + free T4  Sleep disturbance  Motion sickness, initial encounter  Nausea  Snoring    Follow up: pending labs, xray

## 2024-05-14 NOTE — Patient Instructions (Signed)
Please go to [facility] for your neck xray.   Their hours are 8am - 4:30 pm Monday - Friday.  Take your insurance card with you.  [facility] [phone]   [address] [place], [place] [address]

## 2024-05-15 ENCOUNTER — Ambulatory Visit: Payer: Self-pay | Admitting: Medical

## 2024-05-15 LAB — TSH+FREE T4
Free T4: 0.97 ng/dL (ref 0.82–1.77)
TSH: 3.4 u[IU]/mL (ref 0.450–4.500)

## 2024-05-15 NOTE — Progress Notes (Signed)
 Labs through [employer]

## 2024-05-26 NOTE — Progress Notes (Signed)
 Results through MyChart

## 2024-05-26 NOTE — Telephone Encounter (Signed)
 Results already done

## 2024-06-04 ENCOUNTER — Other Ambulatory Visit: Payer: Self-pay | Admitting: Medical

## 2024-06-04 MED ORDER — CYCLOBENZAPRINE HCL 10 MG PO TABS: 10.0000 mg | ORAL_TABLET | Freq: Two times a day (BID) | ORAL | 0 refills | Status: AC | PRN

## 2024-10-02 ENCOUNTER — Other Ambulatory Visit: Payer: Self-pay | Admitting: Medical

## 2024-10-14 ENCOUNTER — Other Ambulatory Visit: Payer: Self-pay | Admitting: Medical

## 2024-10-14 DIAGNOSIS — K219 Gastro-esophageal reflux disease without esophagitis: Secondary | ICD-10-CM

## 2025-01-08 ENCOUNTER — Encounter: Payer: Self-pay | Admitting: Medical
